# Patient Record
Sex: Male | Born: 1975
Health system: Southern US, Community
[De-identification: ages and names within clinical notes are randomized; demographics above are authoritative.]

## PROBLEM LIST (undated history)

## (undated) DIAGNOSIS — K219 Gastro-esophageal reflux disease without esophagitis: Secondary | ICD-10-CM

## (undated) DIAGNOSIS — M1990 Unspecified osteoarthritis, unspecified site: Secondary | ICD-10-CM

## (undated) DIAGNOSIS — F319 Bipolar disorder, unspecified: Secondary | ICD-10-CM

## (undated) DIAGNOSIS — T7840XA Allergy, unspecified, initial encounter: Secondary | ICD-10-CM

## (undated) DIAGNOSIS — E559 Vitamin D deficiency, unspecified: Secondary | ICD-10-CM

## (undated) DIAGNOSIS — F419 Anxiety disorder, unspecified: Secondary | ICD-10-CM

## (undated) HISTORY — DX: Unspecified osteoarthritis, unspecified site: M19.90

## (undated) HISTORY — DX: Vitamin D deficiency, unspecified: E55.9

## (undated) HISTORY — DX: Allergy, unspecified, initial encounter: T78.40XA

## (undated) HISTORY — DX: Anxiety disorder, unspecified: F41.9

## (undated) HISTORY — DX: Gastro-esophageal reflux disease without esophagitis: K21.9

## (undated) HISTORY — DX: Bipolar disorder, unspecified: F31.9

## (undated) HISTORY — PX: PILONIDAL CYST EXCISION: SHX744

---

## 2009-08-14 ENCOUNTER — Emergency Department: Payer: Self-pay | Admitting: Emergency Medicine

## 2009-08-15 ENCOUNTER — Emergency Department: Payer: Self-pay | Admitting: Emergency Medicine

## 2011-02-10 ENCOUNTER — Ambulatory Visit (INDEPENDENT_AMBULATORY_CARE_PROVIDER_SITE_OTHER): Payer: Medicare Other | Admitting: Physician Assistant

## 2011-02-10 DIAGNOSIS — F319 Bipolar disorder, unspecified: Secondary | ICD-10-CM

## 2011-02-26 ENCOUNTER — Encounter (INDEPENDENT_AMBULATORY_CARE_PROVIDER_SITE_OTHER): Payer: Medicare Other | Admitting: Physician Assistant

## 2011-02-26 DIAGNOSIS — F39 Unspecified mood [affective] disorder: Secondary | ICD-10-CM

## 2011-03-26 ENCOUNTER — Encounter (HOSPITAL_COMMUNITY): Payer: Medicare Other | Admitting: Physician Assistant

## 2011-03-31 ENCOUNTER — Encounter (INDEPENDENT_AMBULATORY_CARE_PROVIDER_SITE_OTHER): Payer: Medicare Other | Admitting: Physician Assistant

## 2011-03-31 DIAGNOSIS — F319 Bipolar disorder, unspecified: Secondary | ICD-10-CM

## 2011-06-02 ENCOUNTER — Encounter (HOSPITAL_COMMUNITY): Payer: Medicare Other | Admitting: Physician Assistant

## 2011-07-03 ENCOUNTER — Other Ambulatory Visit (HOSPITAL_COMMUNITY): Payer: Self-pay | Admitting: *Deleted

## 2011-07-03 DIAGNOSIS — F39 Unspecified mood [affective] disorder: Secondary | ICD-10-CM

## 2011-07-03 MED ORDER — LITHIUM CARBONATE ER 450 MG PO TBCR: EXTENDED_RELEASE_TABLET | ORAL | Status: DC

## 2011-07-03 MED ORDER — CLONAZEPAM 2 MG PO TABS: 2.0000 mg | ORAL_TABLET | Freq: Four times a day (QID) | ORAL | Status: DC

## 2011-07-22 ENCOUNTER — Ambulatory Visit (HOSPITAL_COMMUNITY): Payer: Medicare Other | Admitting: Physician Assistant

## 2011-08-21 ENCOUNTER — Other Ambulatory Visit (HOSPITAL_COMMUNITY): Payer: Self-pay | Admitting: Physician Assistant

## 2011-08-21 ENCOUNTER — Other Ambulatory Visit (HOSPITAL_COMMUNITY): Payer: Self-pay

## 2011-08-21 DIAGNOSIS — F39 Unspecified mood [affective] disorder: Secondary | ICD-10-CM

## 2011-08-21 DIAGNOSIS — F319 Bipolar disorder, unspecified: Secondary | ICD-10-CM

## 2011-08-21 MED ORDER — CLONAZEPAM 2 MG PO TABS: 2.0000 mg | ORAL_TABLET | Freq: Four times a day (QID) | ORAL | Status: DC | PRN

## 2011-08-21 MED ORDER — LITHIUM CARBONATE ER 450 MG PO TBCR: EXTENDED_RELEASE_TABLET | ORAL | Status: DC

## 2011-08-21 MED ORDER — OXCARBAZEPINE 300 MG PO TABS: 300.0000 mg | ORAL_TABLET | Freq: Two times a day (BID) | ORAL | Status: DC

## 2011-08-31 ENCOUNTER — Ambulatory Visit (INDEPENDENT_AMBULATORY_CARE_PROVIDER_SITE_OTHER): Payer: Medicare Other | Admitting: Physician Assistant

## 2011-08-31 DIAGNOSIS — Z79899 Other long term (current) drug therapy: Secondary | ICD-10-CM

## 2011-08-31 DIAGNOSIS — F319 Bipolar disorder, unspecified: Secondary | ICD-10-CM

## 2011-08-31 MED ORDER — PRAZOSIN HCL 1 MG PO CAPS: 1.0000 mg | ORAL_CAPSULE | Freq: Every day | ORAL | Status: DC

## 2011-08-31 NOTE — Progress Notes (Signed)
   [facility] Behavioral Health Follow-up Outpatient Visit  Derreon Consalvo August 17, 1976  Date: 08/31/11   Subjective: Lars presents today to follow up on his medications for bipolar. He reports that he is not sleeping well; he falls asleep okay, but then wakes up in our 2 later, after having nightmares. He denies that he is having any auditory or visual hallucinations. He denies having any suicidal or homicidal ideation. He states that he has tried every sleep medication without relief. He says, "name a medication, and I've taken it."  He reports that he was prescribed clonidine in the past, which helped him to sleep, but after taking it a few consecutive nights he had intolerable side effects.  There were no vitals filed for this visit.  Mental Status Examination  Appearance: Casual Alert: Yes Attention: good  Cooperative: Yes Eye Contact: Fair Speech: Clear and even Psychomotor Activity: Increased Memory/Concentration: Intact Oriented: person, place, time/date and situation Mood: Anxious and Irritable Affect: Full Range Thought Processes and Associations: Logical Fund of Knowledge: Fair Thought Content:  Insight: Fair Judgement: Fair  Diagnosis: Bipolar disorder, most recent episode manic  Treatment Plan: We will prescribe for him Minipress to see if it will help his sleep, as well as decrease his nightmares. He will followup in one month.  Kobe Jansma, PA

## 2011-09-25 ENCOUNTER — Other Ambulatory Visit (HOSPITAL_COMMUNITY): Payer: Self-pay | Admitting: Physician Assistant

## 2011-09-25 DIAGNOSIS — F319 Bipolar disorder, unspecified: Secondary | ICD-10-CM

## 2011-09-25 MED ORDER — CLONAZEPAM 2 MG PO TABS: 2.0000 mg | ORAL_TABLET | Freq: Four times a day (QID) | ORAL | Status: DC | PRN

## 2011-09-25 MED ORDER — LITHIUM CARBONATE ER 450 MG PO TBCR: EXTENDED_RELEASE_TABLET | ORAL | Status: DC

## 2011-09-25 MED ORDER — OXCARBAZEPINE 300 MG PO TABS: 300.0000 mg | ORAL_TABLET | Freq: Two times a day (BID) | ORAL | Status: DC

## 2011-09-28 ENCOUNTER — Other Ambulatory Visit (HOSPITAL_COMMUNITY): Payer: Self-pay | Admitting: Physician Assistant

## 2011-09-28 ENCOUNTER — Ambulatory Visit (HOSPITAL_COMMUNITY): Payer: Medicare Other | Admitting: Physician Assistant

## 2011-10-12 ENCOUNTER — Ambulatory Visit (HOSPITAL_COMMUNITY): Payer: Medicare Other | Admitting: Physician Assistant

## 2011-11-02 ENCOUNTER — Ambulatory Visit (INDEPENDENT_AMBULATORY_CARE_PROVIDER_SITE_OTHER): Payer: Medicare Other | Admitting: Physician Assistant

## 2011-11-02 DIAGNOSIS — F319 Bipolar disorder, unspecified: Secondary | ICD-10-CM

## 2011-11-02 DIAGNOSIS — Z79899 Other long term (current) drug therapy: Secondary | ICD-10-CM

## 2011-11-02 NOTE — Progress Notes (Signed)
   [facility] Behavioral Health Follow-up Outpatient Visit  Donta Fuster Dec 31, 1975  Date: 11/02/11   Subjective: Jesua presents today to followup on medications prescribed for bipolar disorder. He reports that he took the Minipress for 3 nights and was able to get caught up on his sleep. He Is currently not taking any more Minipress, and reports that his wife is giving him an herbal sleep aid called Sleep Ease. He reports that his mood is stable. He denies any suicidal or homicidal ideation. He denies any auditory or visual hallucinations.  There were no vitals filed for this visit.  Mental Status Examination  Appearance: Disheveled Alert: Yes Attention: good  Cooperative: Yes Eye Contact: Good Speech: Clear and even Psychomotor Activity: Normal Memory/Concentration: Intact Oriented: person, place, time/date and situation Mood: Euthymic Affect: Congruent Thought Processes and Associations: Intact and Logical Fund of Knowledge: Good Thought Content:  Insight: Fair Judgement: Good  Diagnosis: Bipolar disorder not otherwise specified  Treatment Plan: We will continue his lithium at 450 mg in the morning and 900 mg at night. We will continue his Trileptal at 300 mg twice daily. He will continue taking Klonopin 2 mg up to 4 times daily as needed. He can take the Minipress on an as-needed basis for sleep. We have ordered labs to check his lithium level as well as CBC, seen at, and TSH. He will followup in 3 months.  Aleena Kirkeby, PA

## 2011-11-05 ENCOUNTER — Other Ambulatory Visit (HOSPITAL_COMMUNITY): Payer: Self-pay | Admitting: *Deleted

## 2011-11-05 DIAGNOSIS — F319 Bipolar disorder, unspecified: Secondary | ICD-10-CM

## 2012-01-04 ENCOUNTER — Ambulatory Visit (INDEPENDENT_AMBULATORY_CARE_PROVIDER_SITE_OTHER): Payer: Medicare Other | Admitting: Physician Assistant

## 2012-01-04 DIAGNOSIS — Z79899 Other long term (current) drug therapy: Secondary | ICD-10-CM

## 2012-01-04 DIAGNOSIS — F319 Bipolar disorder, unspecified: Secondary | ICD-10-CM | POA: Insufficient documentation

## 2012-01-04 NOTE — Progress Notes (Signed)
   [facility] Behavioral Health Follow-up Outpatient Visit  Frederick [name] 11-Sep-1975  Date: 01/04/2012   Subjective: Pearly presents today to followup on his medications prescribed for bipolar disorder. He reports that he has been doing well. He endorses a stable mood. His sleep and appetite are both good. He denies any irritability or agitation. He denies any suicidal or homicidal ideation. He denies any auditory or visual hallucinations.  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 This seems to bring him much gratification. He also reports at other times he has been fishing.  There were no vitals filed for this visit.  Mental Status Examination  Appearance: Fairly groomed and casually dressed Alert: Yes Attention: good  Cooperative: Yes Eye Contact: Good Speech: Clear and coherent Psychomotor Activity: Normal Memory/Concentration: Intact Oriented: person, place, time/date and situation Mood: Euthymic Affect: Appropriate Thought Processes and Associations: Coherent and Logical Fund of Knowledge: Good Thought Content: Normal Insight: Good Judgement: Good  Diagnosis: Bipolar disorder NOS  Treatment Plan: We will continue his Klonopin 2 mg up to 4 times daily, Eskalith 450 mg each morning and 900 mg at bedtime, as well as Trileptal 300 mg twice daily. He is no longer taking the Minipress but if he desires refills we will grant him. He will followup in 3 months.  Adaisha Campise, PA-C

## 2012-02-10 ENCOUNTER — Telehealth (HOSPITAL_COMMUNITY): Payer: Self-pay | Admitting: *Deleted

## 2012-02-10 NOTE — Telephone Encounter (Signed)
States [facility] recently changed his medications to mail order, and that [facility] has not received the refills. [facility] Fax [phone] [facility]   [phone]

## 2012-02-15 ENCOUNTER — Other Ambulatory Visit (HOSPITAL_COMMUNITY): Payer: Self-pay | Admitting: *Deleted

## 2012-02-16 ENCOUNTER — Other Ambulatory Visit (HOSPITAL_COMMUNITY): Payer: Self-pay | Admitting: *Deleted

## 2012-02-16 DIAGNOSIS — F319 Bipolar disorder, unspecified: Secondary | ICD-10-CM

## 2012-02-16 MED ORDER — LITHIUM CARBONATE ER 450 MG PO TBCR: EXTENDED_RELEASE_TABLET | ORAL | Status: DC

## 2012-02-16 MED ORDER — OXCARBAZEPINE 300 MG PO TABS: 300.0000 mg | ORAL_TABLET | Freq: Two times a day (BID) | ORAL | Status: DC

## 2012-02-16 MED ORDER — CLONAZEPAM 2 MG PO TABS: 2.0000 mg | ORAL_TABLET | Freq: Four times a day (QID) | ORAL | Status: DC | PRN

## 2012-04-11 ENCOUNTER — Ambulatory Visit (INDEPENDENT_AMBULATORY_CARE_PROVIDER_SITE_OTHER): Payer: Medicare Other | Admitting: Physician Assistant

## 2012-04-11 DIAGNOSIS — F319 Bipolar disorder, unspecified: Secondary | ICD-10-CM

## 2012-04-11 MED ORDER — OXCARBAZEPINE 300 MG PO TABS: ORAL_TABLET | ORAL | Status: DC

## 2012-04-11 MED ORDER — LITHIUM CARBONATE ER 450 MG PO TBCR: EXTENDED_RELEASE_TABLET | ORAL | Status: DC

## 2012-04-11 MED ORDER — CLONAZEPAM 2 MG PO TABS: 2.0000 mg | ORAL_TABLET | Freq: Four times a day (QID) | ORAL | Status: DC | PRN

## 2012-04-11 NOTE — Progress Notes (Signed)
   [facility] Behavioral Health Follow-up Outpatient Visit  Frederick [name] [birth year]  Date: 04/11/2012   Subjective: Frederick [name] presents today to followup on his treatment for bipolar disorder. He reports that last month he began to experience some problems sleeping and felt he was having a manic episode, so he began taking Minipress at bedtime, which resolved his manic episode. He reports that last week his primary care provider did multiple blood tests, and did an ultrasound of his thyroid, liver, and kidneys, presumably because he is on lithium. He is having those results faxed to this office. He has been taking his lithium 450 mg each morning and 900 mg at bedtime, as well as Trileptal 300 mg in the morning and 300 mg at bedtime. He continues to take the Klonopin as prescribed. He denies any suicidal or homicidal ideation. He denies any auditory or visual hallucinations.  There were no vitals filed for this visit.  Mental Status Examination  Appearance: Casual Alert: Yes Attention: good  Cooperative: Yes Eye Contact: Good Speech: Clear and coherent Psychomotor Activity: Normal Memory/Concentration: Intact Oriented: person, place, time/date and situation Mood: Euthymic Affect: Blunt Thought Processes and Associations: Coherent and Logical Fund of Knowledge: Good Thought Content: Normal Insight: Good Judgement: Good  Diagnosis: Polar disorder not otherwise specified  Treatment Plan: We will increase his Trileptal back to 300 mg each morning and 600 mg at bedtime. We'll continue his lithium at 450 mg each morning and 900 mg at bedtime, as well as Klonopin 2 mg 4 times a day when necessary. We will appreciate the results of his recent lab work from his PCP, and he is to call the office in 2 weeks to report on his response to the increased Trileptal. If his laboratory results warrant, we may increase the lithium. He will return for followup in 2 months.  Jesselle Laflamme, PA-C

## 2012-06-20 ENCOUNTER — Ambulatory Visit (INDEPENDENT_AMBULATORY_CARE_PROVIDER_SITE_OTHER): Payer: Medicare Other | Admitting: Physician Assistant

## 2012-06-20 DIAGNOSIS — F3110 Bipolar disorder, current episode manic without psychotic features, unspecified: Secondary | ICD-10-CM

## 2012-06-20 DIAGNOSIS — F319 Bipolar disorder, unspecified: Secondary | ICD-10-CM

## 2012-06-20 NOTE — Progress Notes (Signed)
   [facility] Behavioral Health Follow-up Outpatient Visit  Frederick [name] [birth year]  Date: 06/20/2012   Subjective: Frederick [name] presents today to followup on his treatment for bipolar disorder. At his last appointment we increased his Trileptal to 300 mg each morning, and 600 mg at bedtime. He reports that this has improved his sleep significantly, and he no longer has any feelings of mania. He does complain that the increased dose of Trileptal causes him to have a hard stool, which causes painful defecation. He is able to adjust his dose of Trileptal as needed to avoid this being too much of a problem. He reports that he still has disturbing dreams from time to time, but will take a Minipress which helps to relieve that. He reports that Dr. Lacie Scotts at [facility] completed the tests and, per the patient's report, everything was fine. Nuh denies any suicidal or homicidal ideation. He denies any auditory or visual hallucinations.  There were no vitals filed for this visit.  Mental Status Examination  Appearance: Fairly groomed and casually dressed Alert: Yes Attention: good  Cooperative: Yes Eye Contact: Good Speech: Clear and coherent Psychomotor Activity: Normal Memory/Concentration: Intact Oriented: person, place, time/date and situation Mood: Euthymic Affect: Appropriate Thought Processes and Associations: Linear Fund of Knowledge: Good Thought Content: Normal Insight: Good Judgement: Good  Diagnosis: Bipolar disorder NOS, most recent episode manic  Treatment Plan: We will continue his lithium 450 mg each morning and 900 mg at bedtime, Trileptal 300 mg each morning and 600 mg at bedtime, and Klonopin 2 mg 4 times daily as needed for anxiety. He is also prescribed Minipress 1 mg to take at bedtime can use on an as-needed basis. He will return for followup in 3 months.  Dovie Kapusta, PA-C

## 2012-07-07 ENCOUNTER — Other Ambulatory Visit (HOSPITAL_COMMUNITY): Payer: Self-pay | Admitting: *Deleted

## 2012-07-07 DIAGNOSIS — F319 Bipolar disorder, unspecified: Secondary | ICD-10-CM

## 2012-07-07 MED ORDER — LITHIUM CARBONATE ER 450 MG PO TBCR: EXTENDED_RELEASE_TABLET | ORAL | Status: DC

## 2012-07-07 MED ORDER — OXCARBAZEPINE 300 MG PO TABS: ORAL_TABLET | ORAL | Status: DC

## 2012-09-20 ENCOUNTER — Ambulatory Visit (INDEPENDENT_AMBULATORY_CARE_PROVIDER_SITE_OTHER): Payer: Medicare Other | Admitting: Physician Assistant

## 2012-09-20 DIAGNOSIS — F319 Bipolar disorder, unspecified: Secondary | ICD-10-CM

## 2012-09-20 MED ORDER — CLONIDINE HCL 0.1 MG PO TABS: 0.1000 mg | ORAL_TABLET | Freq: Every day | ORAL | Status: DC

## 2012-09-20 MED ORDER — CLONAZEPAM 2 MG PO TABS: 2.0000 mg | ORAL_TABLET | Freq: Four times a day (QID) | ORAL | Status: DC | PRN

## 2012-09-20 NOTE — Progress Notes (Signed)
   Frederick [name]  Frederick [name] 1976/03/26  Date: 09/20/2012   Subjective: [name] presents today to followup on his treatment for bipolar disorder. He reports that beginning in mid-December through the first of the year he contracted bronchitis which turned into pneumonia and he was bed ridden. This caused has sleep pattern to become skewed, and he has been sleeping during the day and up at night. He feels that this is some mild mania, but he has not experienced other symptoms. He denies any suicidal or homicidal ideation. He denies any auditory or visual hallucinations.  There were no vitals filed for this [name].  Mental Status Examination  Appearance: Casual Alert: Yes Attention: good  Cooperative: Yes Eye Contact: Good Speech: Clear and coherent Psychomotor Activity: Normal Memory/Concentration: Intact Oriented: person, place, time/date and situation Mood: Dysphoric Affect: Congruent Thought Processes and Associations: Linear Fund of Knowledge: Good Thought Content: Normal Insight: Good Judgement: Good  Diagnosis: Bipolar disorder NOS  Treatment Plan: We will continue the Klonopin 2 mg 4 times daily, lithium 450 mg each morning and 900 mg at bedtime, Trileptal 300 mg each morning and 600 mg at bedtime, and Minipress 1 mg at bedtime. We'll add to that clonidine 0.1 mg at bedtime as needed for sleep. He will return for followup in 3 months.  Adryanna Friedt, PA-C

## 2012-12-20 ENCOUNTER — Ambulatory Visit (INDEPENDENT_AMBULATORY_CARE_PROVIDER_SITE_OTHER): Payer: 59 | Admitting: Physician Assistant

## 2012-12-20 VITALS — BP 119/80 | HR 68 | Ht 74.0 in | Wt 238.0 lb

## 2012-12-20 DIAGNOSIS — F319 Bipolar disorder, unspecified: Secondary | ICD-10-CM

## 2012-12-20 MED ORDER — OXCARBAZEPINE 300 MG PO TABS: ORAL_TABLET | ORAL | Status: DC

## 2012-12-20 MED ORDER — CLONAZEPAM 2 MG PO TABS: 2.0000 mg | ORAL_TABLET | Freq: Four times a day (QID) | ORAL | Status: DC | PRN

## 2012-12-20 MED ORDER — LITHIUM CARBONATE ER 450 MG PO TBCR: EXTENDED_RELEASE_TABLET | ORAL | Status: DC

## 2012-12-20 NOTE — Progress Notes (Signed)
   [facility] Behavioral Health Follow-up Outpatient Visit  Feras Gardella 1976/03/23  Date: 12/20/2012   Subjective: Maxmilian presents today to followup on his treatment for bipolar disorder. He states "I am better than I was." He reports that his sleep pattern has normalized. He used the clonidine to help him to adjust his sleep pattern, and it was helpful. He reports that his mood is stable. 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 He dislocated the fourth finger on his right hand playing softball. He denies any suicidal or homicidal ideation. He denies any auditory or visual hallucinations.  Filed Vitals:   12/20/12 1321  BP: 119/80  Pulse: 68    Mental Status Examination  Appearance: Casual Alert: Yes Attention: good  Cooperative: Yes Eye Contact: Good Speech: Clear and coherent Psychomotor Activity: Normal Memory/Concentration: Intact Oriented: person, place, time/date and situation Mood: Euthymic Affect: Congruent Thought Processes and Associations: Linear Fund of Knowledge: Good Thought Content: Normal Insight: Good Judgement: Good  Diagnosis: Bipolar disorder, unspecified  Treatment Plan: We will continue his clonazepam 2 mg 4 times daily as needed, lithium 450 mg each morning and 900 mg at bedtime, Trileptal 300 mg each morning and 600 mg at bedtime, and clonidine 0.1 mg at bedtime as needed for sleep. We will discontinue the Minipress. He'll return for followup in 3 months.  Mozelle Remlinger, PA-C

## 2013-03-28 ENCOUNTER — Encounter (HOSPITAL_COMMUNITY): Payer: Self-pay | Admitting: Physician Assistant

## 2013-03-28 ENCOUNTER — Ambulatory Visit (INDEPENDENT_AMBULATORY_CARE_PROVIDER_SITE_OTHER): Payer: Medicare Other | Admitting: Physician Assistant

## 2013-03-28 VITALS — BP 122/88 | HR 82 | Ht 74.0 in | Wt 225.8 lb

## 2013-03-28 DIAGNOSIS — F319 Bipolar disorder, unspecified: Secondary | ICD-10-CM

## 2013-03-28 MED ORDER — OXCARBAZEPINE 300 MG PO TABS: ORAL_TABLET | ORAL | Status: DC

## 2013-03-28 MED ORDER — LITHIUM CARBONATE ER 450 MG PO TBCR: EXTENDED_RELEASE_TABLET | ORAL | Status: DC

## 2013-03-28 MED ORDER — CLONAZEPAM 2 MG PO TABS: 2.0000 mg | ORAL_TABLET | Freq: Four times a day (QID) | ORAL | Status: DC | PRN

## 2013-03-28 NOTE — Progress Notes (Signed)
[facility] Behavioral [facility] [id number] Progress Note  Frederick [name] [id number] y.o.  03/28/2013 1:22 PM  Chief Complaint: Followup visit for bipolar disorder  History of Present Illness: Frederick [name] presents today to followup on his treatment for bipolar disorder. He reports that his mood has been stable. His sleep has been good. He has had to use the clonidine on 2 or 3 occasions since he was last here to help him to reestablish his sleep pattern when he gets off. He continues to spend his time working on his home, and states that he only has one more room to complete. 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 He is also been to [place] caring for his 8 year old grandmother who is suffering from the sequela of diabetes.  Suicidal Ideation: No Plan Formed: No Patient has means to carry out plan: No  Homicidal Ideation: No Plan Formed: No Patient has means to carry out plan: No  Review of Systems: Psychiatric: Agitation: No Hallucination: No Depressed Mood: No Insomnia: No Hypersomnia: No Altered Concentration: No Feels Worthless: No Grandiose Ideas: No Belief In Special Powers: No New/Increased Substance Abuse: No Compulsions: No  Neurologic: Headache: No Seizure: No Paresthesias: No  Past Medical Family, Social History: None on file  Outpatient Encounter Prescriptions as of 03/28/2013  Medication Sig Dispense Refill  . clonazePAM (KLONOPIN) 2 MG tablet Take 1 tablet (2 mg total) by mouth 4 (four) times daily as needed for anxiety.  120 tablet  2  . cloNIDine (CATAPRES) 0.1 MG tablet Take 1 tablet (0.1 mg total) by mouth at bedtime.  30 tablet  2  . lithium carbonate (ESKALITH) 450 MG CR tablet Take one tablet by mouth in the morning and two tablets by mouth every evening  270 tablet  0  . Oxcarbazepine (TRILEPTAL) 300 MG tablet Take one tablet each morning, and two at bedtime.  270 tablet  0  . [DISCONTINUED] clonazePAM (KLONOPIN) 2 MG tablet Take 1 tablet (2  mg total) by mouth 4 (four) times daily as needed for anxiety.  120 tablet  2  . [DISCONTINUED] lithium carbonate (ESKALITH) 450 MG CR tablet Take one tablet by mouth in the morning and two tablets by mouth every evening  270 tablet  0  . [DISCONTINUED] Oxcarbazepine (TRILEPTAL) 300 MG tablet Take one tablet each morning, and two at bedtime.  270 tablet  0   No facility-administered encounter medications on file as of 03/28/2013.    Past Psychiatric History/Hospitalization(s): Anxiety: Yes Bipolar Disorder: Yes Depression: Yes Mania: Yes Psychosis: No Schizophrenia: No Personality Disorder: No Hospitalization for psychiatric illness: No History of Electroconvulsive Shock Therapy: No Prior Suicide Attempts: No  Physical Exam: Constitutional:  Vital signs:  BP 122/88, Pulse 82, Height 6'2", Weight 225#  General Appearance: alert, oriented, no acute distress, well nourished and casually dressed  Musculoskeletal: Strength & Muscle Tone: within normal limits Gait & Station: normal Patient leans: N/A  Psychiatric: Speech (describe rate, volume, coherence, spontaneity, and abnormalities if any): Clear and coherent at a rate rate and rhythm and normal volume  Thought Process (describe rate, content, abstract reasoning, and computation): Within normal limit  Associations: Intact  Thoughts: normal  Mental Status: Orientation: oriented to person, place, time/date and situation Mood & Affect: normal affect Attention Span & Concentration: Intact  Medical Decision Making (Choose Three): Established Problem, Stable/Improving (1), Review of Psycho-Social Stressors (1) and Review of Medication Regimen & Side Effects (2)  Assessment: Axis I: Bipolar disorder not otherwise  specified  Axis II: Deferred  Axis III: Noncontributory  Axis IV: Mild  Axis V: 75   Plan: We will continue his Klonopin 2 mg 4 times daily as needed, lithium 450 mg each morning and 900 mg at bedtime,  Trileptal 300 mg each morning and 600 mg at bedtime, and clonidine 0.1 mg at bedtime as needed for sleep. He will return for followup in 3 months. If he continues to remain stable we can begin to stretch his appointments out.  Naziah Portee, PA-C 03/28/2013

## 2013-06-28 ENCOUNTER — Ambulatory Visit (HOSPITAL_COMMUNITY): Payer: Self-pay | Admitting: Physician Assistant

## 2013-06-29 ENCOUNTER — Ambulatory Visit (HOSPITAL_COMMUNITY): Payer: Self-pay | Admitting: Psychiatry

## 2013-09-06 ENCOUNTER — Encounter (INDEPENDENT_AMBULATORY_CARE_PROVIDER_SITE_OTHER): Payer: Self-pay

## 2013-09-06 ENCOUNTER — Ambulatory Visit (INDEPENDENT_AMBULATORY_CARE_PROVIDER_SITE_OTHER): Payer: Medicare Other | Admitting: Psychiatry

## 2013-09-06 ENCOUNTER — Ambulatory Visit (HOSPITAL_COMMUNITY): Payer: Self-pay | Admitting: Psychiatry

## 2013-09-06 DIAGNOSIS — F319 Bipolar disorder, unspecified: Secondary | ICD-10-CM | POA: Diagnosis not present

## 2013-09-06 DIAGNOSIS — F3162 Bipolar disorder, current episode mixed, moderate: Secondary | ICD-10-CM | POA: Insufficient documentation

## 2013-09-06 MED ORDER — CLONAZEPAM 2 MG PO TABS: 2.0000 mg | ORAL_TABLET | Freq: Four times a day (QID) | ORAL | Status: DC | PRN

## 2013-09-06 MED ORDER — LITHIUM CARBONATE ER 450 MG PO TBCR: EXTENDED_RELEASE_TABLET | ORAL | Status: DC

## 2013-09-06 MED ORDER — OXCARBAZEPINE 300 MG PO TABS: ORAL_TABLET | ORAL | Status: DC

## 2013-09-06 NOTE — Progress Notes (Signed)
[facility]  09/06/2013 3:37 PM Gaylord ShihWilliam Vaughn  MRN:  [id number] Subjective:  Easy-going This patient is a 38 year old white married father who is on disability or bipolar disorder and he says other conditions. This has been married for 17 years and is stable marriage to a Engineer, civil (consulting)nurse. He is to sons age 40 and 5311 who are here with him today. The patient recently has started taking one class at a local community college. Today the patient denies being depressed. He is sleeping and eating well. He's got good energy. He is no complaints of concentration and a good sense of worth. He denies suicidal ideation he denies any suicide attempts. The patient is able to enjoy music and fishing. He also reads. In evaluation of past episodes of illness it is difficult to elicit them. A close evaluation the patient is unable to describe episodes of euphoria or intense irritability. Yet it is noted he is psychiatrically hospitalized 2 times the last one in 2004. He knows the hospitals in [place] but doesn't know its name. The patient describes an episode of illness is characterized by super amount of energy, rapid speech racing thoughts and decreased need for sleep. He is very distractible and scattered during the episode. During the episode he denies being your double nor being euphoric. I suspect the interpretation of his mood is made by the treating psychiatrist and perhaps also by his wife who is not here today. I am unclear of the index case of illness of this patient. What is evident is that he is on massive amounts of medicines. He takes lithium 450 mg one in the morning and 2 at night, Trileptal 300 mg one in the morning and 2 at night and Klonopin 2 mg 4 times a day. He also takes clonidine point one at night at times. The patient says his last episode occurred when his grandmother died. The patient is not accurate are clear about when episodes occurred in the past. Diagnosis:   DSM5: Schizophrenia Disorders:    Obsessive-Compulsive Disorders:   Trauma-Stressor Disorders:   Substance/Addictive Disorders:   Depressive Disorders:    Axis I: Bipolar, Manic  ADL's:  Intact  Sleep: Good  Appetite:  Good  Suicidal Ideation:  no Homicidal Ideation:  no AEB (as evidenced by):  Psychiatric Specialty Exam: ROS  There were no vitals taken for this visit.There is no weight on file to calculate BMI.  General Appearance: Casual  Eye Contact::  Good  Speech:  Clear and Coherent  Volume:  Normal  Mood:  Euthymic  Affect:  Constricted  Thought Process:  Coherent  Orientation:  Full (Time, Place, and Person)  Thought Content:  NA and WDL  Suicidal Thoughts:  No  Homicidal Thoughts:  No  Memory:  nl  Judgement:  Good  Insight:  Fair  Psychomotor Activity:  Normal  Concentration:  Fair  Recall:  Good  Akathisia:  No  Handed:  Right  AIMS (if indicated):     Assets:  Desire for Improvement  Sleep:      Current Medications: Current Outpatient Prescriptions  Medication Sig Dispense Refill  . clonazePAM (KLONOPIN) 2 MG tablet Take 1 tablet (2 mg total) by mouth 4 (four) times daily as needed for anxiety.  120 tablet  1  . cloNIDine (CATAPRES) 0.1 MG tablet Take 1 tablet (0.1 mg total) by mouth at bedtime.  30 tablet  2  . lithium carbonate (ESKALITH) 450 MG CR tablet Take one tablet by mouth in  the morning and two tablets by mouth every evening  270 tablet  1  . Oxcarbazepine (TRILEPTAL) 300 MG tablet Take one tablet each morning, and two at bedtime.  270 tablet  0   No current facility-administered medications for this visit.    Lab Results: No results found for this or any previous visit (from the past 48 hour(s)).  Physical Findings: AIMS:  , ,  ,  ,    CIWA:    COWS:     Treatment Plan Summary: At this time given that this was a short visit an initial meeting for this very complex patient I will go ahead and continue his medicines for the next month or 2 and reevaluate him. He  claims he has been on his medicines for years. This includes Klonopin 2 mg 4 times a day, lithium 451 morning 2 at night and Trileptal 300 mg one in the morning and 2 at night. This patient was asked on his next visit to bring his wife with him. If this does not occur I reviewed his past notes in our system and reevaluate the need for such a high dose of Klonopin. At this time the patient Morrie Sheldon seems to be quite stable. I am hesitant to be stabilizing. On his next visit which I have an extensive evaluation of any substance misuse or abuse.  Plan:  Medical Decision Making Problem Points:  Established problem, stable/improving (1) Data Points:  Review of medication regiment & side effects (2)  I certify that inpatient services furnished can reasonably be expected to improve the patient's condition.   Lucas Mallow 09/06/2013, 3:37 PM

## 2013-10-30 DIAGNOSIS — Z1339 Encounter for screening examination for other mental health and behavioral disorders: Secondary | ICD-10-CM | POA: Diagnosis not present

## 2013-10-30 DIAGNOSIS — Z79899 Other long term (current) drug therapy: Secondary | ICD-10-CM | POA: Diagnosis not present

## 2013-10-30 DIAGNOSIS — F3112 Bipolar disorder, current episode manic without psychotic features, moderate: Secondary | ICD-10-CM | POA: Diagnosis not present

## 2013-11-15 ENCOUNTER — Ambulatory Visit (HOSPITAL_COMMUNITY): Payer: Self-pay | Admitting: Psychiatry

## 2013-12-28 DIAGNOSIS — F3112 Bipolar disorder, current episode manic without psychotic features, moderate: Secondary | ICD-10-CM | POA: Diagnosis not present

## 2013-12-28 DIAGNOSIS — Z79899 Other long term (current) drug therapy: Secondary | ICD-10-CM | POA: Diagnosis not present

## 2014-02-28 DIAGNOSIS — F3112 Bipolar disorder, current episode manic without psychotic features, moderate: Secondary | ICD-10-CM | POA: Diagnosis not present

## 2014-05-11 DIAGNOSIS — M545 Low back pain, unspecified: Secondary | ICD-10-CM | POA: Diagnosis not present

## 2014-05-11 DIAGNOSIS — Z79899 Other long term (current) drug therapy: Secondary | ICD-10-CM | POA: Diagnosis not present

## 2014-05-11 DIAGNOSIS — E785 Hyperlipidemia, unspecified: Secondary | ICD-10-CM | POA: Diagnosis not present

## 2014-05-11 DIAGNOSIS — M25559 Pain in unspecified hip: Secondary | ICD-10-CM | POA: Diagnosis not present

## 2014-05-11 DIAGNOSIS — F17200 Nicotine dependence, unspecified, uncomplicated: Secondary | ICD-10-CM | POA: Diagnosis not present

## 2014-05-28 DIAGNOSIS — M25559 Pain in unspecified hip: Secondary | ICD-10-CM | POA: Diagnosis not present

## 2014-05-28 DIAGNOSIS — M25552 Pain in left hip: Secondary | ICD-10-CM | POA: Diagnosis not present

## 2014-06-27 DIAGNOSIS — Z79899 Other long term (current) drug therapy: Secondary | ICD-10-CM | POA: Diagnosis not present

## 2014-06-27 DIAGNOSIS — F3160 Bipolar disorder, current episode mixed, unspecified: Secondary | ICD-10-CM | POA: Diagnosis not present

## 2014-07-06 DIAGNOSIS — S838X2A Sprain of other specified parts of left knee, initial encounter: Secondary | ICD-10-CM | POA: Diagnosis not present

## 2014-07-11 DIAGNOSIS — M25461 Effusion, right knee: Secondary | ICD-10-CM | POA: Diagnosis not present

## 2014-07-11 DIAGNOSIS — M2391 Unspecified internal derangement of right knee: Secondary | ICD-10-CM | POA: Diagnosis not present

## 2014-09-26 DIAGNOSIS — F3160 Bipolar disorder, current episode mixed, unspecified: Secondary | ICD-10-CM | POA: Diagnosis not present

## 2014-09-26 DIAGNOSIS — Z79899 Other long term (current) drug therapy: Secondary | ICD-10-CM | POA: Diagnosis not present

## 2015-02-12 DIAGNOSIS — Z79899 Other long term (current) drug therapy: Secondary | ICD-10-CM | POA: Diagnosis not present

## 2015-02-12 DIAGNOSIS — F3160 Bipolar disorder, current episode mixed, unspecified: Secondary | ICD-10-CM | POA: Diagnosis not present

## 2015-05-20 DIAGNOSIS — J019 Acute sinusitis, unspecified: Secondary | ICD-10-CM | POA: Diagnosis not present

## 2015-06-17 DIAGNOSIS — F3160 Bipolar disorder, current episode mixed, unspecified: Secondary | ICD-10-CM | POA: Diagnosis not present

## 2015-08-05 DIAGNOSIS — L02415 Cutaneous abscess of right lower limb: Secondary | ICD-10-CM | POA: Diagnosis not present

## 2015-08-07 DIAGNOSIS — L02415 Cutaneous abscess of right lower limb: Secondary | ICD-10-CM | POA: Diagnosis not present

## 2015-10-14 DIAGNOSIS — Z79899 Other long term (current) drug therapy: Secondary | ICD-10-CM | POA: Diagnosis not present

## 2015-10-14 DIAGNOSIS — F3160 Bipolar disorder, current episode mixed, unspecified: Secondary | ICD-10-CM | POA: Diagnosis not present

## 2015-10-22 ENCOUNTER — Ambulatory Visit: Payer: Self-pay | Admitting: Primary Care

## 2015-10-28 ENCOUNTER — Encounter: Payer: Self-pay | Admitting: Primary Care

## 2015-10-28 ENCOUNTER — Ambulatory Visit (INDEPENDENT_AMBULATORY_CARE_PROVIDER_SITE_OTHER): Payer: Medicare Other | Admitting: Primary Care

## 2015-10-28 VITALS — BP 124/86 | HR 77 | Temp 97.9°F | Ht 72.0 in | Wt 248.8 lb

## 2015-10-28 DIAGNOSIS — Z79899 Other long term (current) drug therapy: Secondary | ICD-10-CM

## 2015-10-28 DIAGNOSIS — M546 Pain in thoracic spine: Secondary | ICD-10-CM

## 2015-10-28 DIAGNOSIS — F319 Bipolar disorder, unspecified: Secondary | ICD-10-CM | POA: Diagnosis not present

## 2015-10-28 DIAGNOSIS — M549 Dorsalgia, unspecified: Secondary | ICD-10-CM | POA: Insufficient documentation

## 2015-10-28 LAB — COMPREHENSIVE METABOLIC PANEL
ALK PHOS: 86 U/L (ref 39–117)
ALT: 123 U/L — AB (ref 0–53)
AST: 57 U/L — AB (ref 0–37)
Albumin: 4.7 g/dL (ref 3.5–5.2)
BUN: 13 mg/dL (ref 6–23)
CO2: 26 mEq/L (ref 19–32)
Calcium: 9.5 mg/dL (ref 8.4–10.5)
Chloride: 106 mEq/L (ref 96–112)
Creatinine, Ser: 0.97 mg/dL (ref 0.40–1.50)
GFR: 90.32 mL/min (ref >60.00)
Glucose, Bld: 104 mg/dL — ABNORMAL HIGH (ref 70–99)
Potassium: 4.5 mEq/L (ref 3.5–5.1)
Sodium: 140 mEq/L (ref 135–145)
TOTAL PROTEIN: 7.6 g/dL (ref 6.0–8.3)
Total Bilirubin: 0.5 mg/dL (ref 0.2–1.2)

## 2015-10-28 NOTE — Progress Notes (Signed)
Subjective:    Patient ID: Frederick [name], male    DOB: 1976/05/05, 40 y.o.   MRN: [id number]  HPI  Frederick [name] is a 40 year old male who presents today to establish care and discuss the problems mentioned below. Will obtain old records. His last physical was years ago.   1) Back pain: Located to right side of thoracic back. He describes his pain as a cramping sensation that will radiate to his right obliques. He plays disc golf everyday and will repetitively twist his body in order to throw the frisbee. His pain has been present for 6 months total and is no worse. Denies numbness/tingling, radiation of pain to his lower extremities. He did fall on his back 3 weeks ago while trying to throw the frisbee, but this pain is the same as his prior pain several months ago.  2) Bipolar Disorder: Diagnosed years ago. Currently follows with Dr. Adron Bene in [place], [place]. He is managed on Lithium 450 mg, Oxcarbazepine 300 mg, and clonazepam 2 mg. Denies SI/HI. He follows with Dr. Janeece Riggers every 3 months.   Review of Systems  Respiratory: Negative for shortness of breath.   Cardiovascular: Negative for chest pain.  Musculoskeletal: Positive for myalgias and back pain.  Skin: Negative for wound.  Neurological: Negative for dizziness and numbness.  Psychiatric/Behavioral: Negative for suicidal ideas. The patient is not nervous/anxious.        Past Medical History  Diagnosis Date  . Bipolar disorder [facility])     Social History   Social History  . Marital Status: Married    Spouse Name: N/A  . Number of Children: N/A  . Years of Education: N/A   Occupational History  . Not on file.   Social History Main Topics  . Smoking status: Current Every Day Smoker -- 0.25 packs/day  . Smokeless tobacco: Not on file  . Alcohol Use: No  . Drug Use: No  . Sexual Activity: Not on file   Other Topics Concern  . Not on file   Social History Narrative   Married.   2 children.   Disabled.   Enjoys playing  disc golf.     No past surgical history on file.  Family History  Problem Relation Age of Onset  . Bipolar disorder Mother   . Arthritis Father   . Bipolar disorder Brother     Allergies  Allergen Reactions  . Alprazolam Itching and Nausea And Vomiting  . Clindamycin Hives  . Ziprasidone Hcl Itching    Current Outpatient Prescriptions on File Prior to Visit  Medication Sig Dispense Refill  . clonazePAM (KLONOPIN) 2 MG tablet Take 1 tablet (2 mg total) by mouth 4 (four) times daily as needed for anxiety. 120 tablet 1  . lithium carbonate (ESKALITH) 450 MG CR tablet Take one tablet by mouth in the morning and two tablets by mouth every evening 270 tablet 1  . Oxcarbazepine (TRILEPTAL) 300 MG tablet Take one tablet each morning, and two at bedtime. 270 tablet 0   No current facility-administered medications on file prior to visit.    BP 124/86 mmHg  Pulse 77  Temp(Src) 97.9 F (36.6 C) (Oral)  Ht 6' (1.829 m)  Wt 248 lb 12.8 oz (112.855 kg)  BMI 33.74 kg/m2  SpO2 98%    Objective:   Physical Exam  Constitutional: He appears well-nourished.  Neck: Neck supple.  Cardiovascular: Normal rate and regular rhythm.   Pulmonary/Chest: Effort normal and breath sounds normal.  Musculoskeletal:       Arms: Good ROM. Pain to right lower thoracic spine as indicated on picture. Small mass noted upon palpation that has been present for years per patient. Mass and area of concern are non tender.  Skin: Skin is warm and dry.  Psychiatric: He has a normal mood and affect.          Assessment & Plan:

## 2015-10-28 NOTE — Assessment & Plan Note (Signed)
Manic per patient. [place] Mood stable today. He is requesting lab testing for lithium levels as he is overdue. Lab orders placed and will fax over to psychiatry once received.

## 2015-10-28 NOTE — Progress Notes (Signed)
Pre visit review using our clinic review tool, if applicable. No additional management support is needed unless otherwise documented below in the visit note. 

## 2015-10-28 NOTE — Patient Instructions (Signed)
Complete lab work prior to leaving today. I will notify you of your results once received.   Your muscle pain is likely related to the repetitive twisting motion of disc golf. Try to refrain from extreme twisting or movement of your right side if possible. Please notify me if your discomfort becomes worse.  Please schedule a physical with me within the next 3-6 months. You may also schedule a lab only appointment 3-4 days prior. We will discuss your lab results in detail during your physical.  It was a pleasure to meet you today! Please don't hesitate to call me with any questions. Welcome to [facility]!

## 2015-10-28 NOTE — Assessment & Plan Note (Signed)
Present for 6 months. Located to right lower thoracic spine, worse with movement with playing disc golf. Exam unremarkable, no spine involvement.  Suspect symptoms related to repetitive twisting of disc golf as he plays frequently. Will treat with supportive measures and have him follow up if no improvement.

## 2015-10-29 LAB — LITHIUM LEVEL: LITHIUM LVL: 0.3 meq/L — AB (ref 0.80–1.40)

## 2015-10-30 ENCOUNTER — Encounter: Payer: Self-pay | Admitting: *Deleted

## 2015-11-21 ENCOUNTER — Encounter: Payer: Self-pay | Admitting: Primary Care

## 2015-11-21 ENCOUNTER — Ambulatory Visit (INDEPENDENT_AMBULATORY_CARE_PROVIDER_SITE_OTHER): Payer: Medicare Other | Admitting: Primary Care

## 2015-11-21 VITALS — BP 124/84 | HR 73 | Temp 97.7°F | Ht 72.0 in | Wt 246.0 lb

## 2015-11-21 DIAGNOSIS — H6121 Impacted cerumen, right ear: Secondary | ICD-10-CM | POA: Diagnosis not present

## 2015-11-21 NOTE — Progress Notes (Signed)
Pre visit review using our clinic review tool, if applicable. No additional management support is needed unless otherwise documented below in the visit note. 

## 2015-11-21 NOTE — Progress Notes (Signed)
   Subjective:    Patient ID: Frederick [name], male    DOB: 01/24/1976, 40 y.o.   MRN: [id number]  HPI  Frederick [name] is a [age] year old male who presents today with a chief complaint of ear fullness. His fullness is located to bilateral ears but right ear more so. He believes his right ear is impacted with cerumen. Denies fevers, cough, sore throat, congestion. His wife attempted to remove the ear wax with a q-tip and saline flushes without improvement.   Review of Systems  Constitutional: Negative for fever.  HENT: Negative for congestion, ear pain and sore throat.   Respiratory: Negative for cough.        Past Medical History  Diagnosis Date  . Bipolar disorder [facility](HCC)     Social History   Social History  . Marital Status: Married    Spouse Name: N/A  . Number of Children: N/A  . Years of Education: N/A   Occupational History  . Not on file.   Social History Main Topics  . Smoking status: Current Every Day Smoker -- 0.25 packs/day  . Smokeless tobacco: Not on file  . Alcohol Use: No  . Drug Use: No  . Sexual Activity: Not on file   Other Topics Concern  . Not on file   Social History Narrative   Married.   2 children.   Disabled.   Enjoys playing disc golf.     No past surgical history on file.  Family History  Problem Relation Age of Onset  . Bipolar disorder Mother   . Arthritis Father   . Bipolar disorder Brother     Allergies  Allergen Reactions  . Alprazolam Itching and Nausea And Vomiting  . Clindamycin Hives  . Ziprasidone Hcl Itching    Current Outpatient Prescriptions on File Prior to Visit  Medication Sig Dispense Refill  . clonazePAM (KLONOPIN) 2 MG tablet Take 1 tablet (2 mg total) by mouth 4 (four) times daily as needed for anxiety. 120 tablet 1  . lithium carbonate (ESKALITH) 450 MG CR tablet Take one tablet by mouth in the morning and two tablets by mouth every evening 270 tablet 1  . Oxcarbazepine (TRILEPTAL) 300 MG tablet Take one tablet  each morning, and two at bedtime. 270 tablet 0   No current facility-administered medications on file prior to visit.    BP 124/84 mmHg  Pulse 73  Temp(Src) 97.7 F (36.5 C) (Oral)  Ht 6' (1.829 m)  Wt 246 lb (111.585 kg)  BMI 33.36 kg/m2  SpO2 98%    Objective:   Physical Exam  Constitutional: He appears well-nourished.  HENT:  Left Ear: Tympanic membrane and ear canal normal. Tympanic membrane is not injected and not erythematous.  Nose: Nose normal. Right sinus exhibits no maxillary sinus tenderness and no frontal sinus tenderness. Left sinus exhibits no maxillary sinus tenderness and no frontal sinus tenderness.  Mouth/Throat: Oropharynx is clear and moist.  Right TM moderately impacted with cerumen, left TM without impaction, TM with small effusion. Right TM post irrigation   Skin: Skin is warm and dry.          Assessment & Plan:  Cerumen Impaction:  Right canal moderately impacted with cerumen. Unable to remove cerumen through instrumentation alone. Irrigation preformed to right ear by CMA. Right TM post irrigation WNL. Discussed cerumen impaction and to use Debrox drops for removal. Follow up PRN.

## 2015-11-21 NOTE — Patient Instructions (Addendum)
Do not use q-tips in your ears.  To help soften ear wax, use Debrox drops once or twice weekly. This will help to soften the wax to allow for complete removal.  We will monitor your liver function again during your upcoming physical.  It was a pleasure to see you today!  Cerumen Impaction The structures of the external ear canal secrete a waxy substance known as cerumen. Excess cerumen can build up in the ear canal, causing a condition known as cerumen impaction. Cerumen impaction can cause ear pain and disrupt the function of the ear. The rate of cerumen production differs for each individual. In certain individuals, the configuration of the ear canal may decrease his or her ability to naturally remove cerumen. CAUSES Cerumen impaction is caused by excessive cerumen production or buildup. RISK FACTORS  Frequent use of swabs to clean ears.  Having narrow ear canals.  Having eczema.  Being dehydrated. SIGNS AND SYMPTOMS  Diminished hearing.  Ear drainage.  Ear pain.  Ear itch. TREATMENT Treatment may involve:  Over-the-counter or prescription ear drops to soften the cerumen.  Removal of cerumen by a health care provider. This may be done with:  Irrigation with warm water. This is the most common method of removal.  Ear curettes and other instruments.  Surgery. This may be done in severe cases. HOME CARE INSTRUCTIONS  Take medicines only as directed by your health care provider.  Do not insert objects into the ear with the intent of cleaning the ear. PREVENTION  Do not insert objects into the ear, even with the intent of cleaning the ear. Removing cerumen as a part of normal hygiene is not necessary, and the use of swabs in the ear canal is not recommended.  Drink enough water to keep your urine clear or pale yellow.  Control your eczema if you have it. SEEK MEDICAL CARE IF:  You develop ear pain.  You develop bleeding from the ear.  The cerumen does not  clear after you use ear drops as directed.   This information is not intended to replace advice given to you by your health care provider. Make sure you discuss any questions you have with your health care provider.   Document Released: 09/10/2004 Document Revised: 08/24/2014 Document Reviewed: 03/20/2015 Elsevier Interactive Patient Education [employer]2016 [employer] Inc.

## 2015-12-13 ENCOUNTER — Encounter: Payer: Self-pay | Admitting: Primary Care

## 2016-01-09 DIAGNOSIS — F319 Bipolar disorder, unspecified: Secondary | ICD-10-CM | POA: Diagnosis not present

## 2016-01-17 ENCOUNTER — Other Ambulatory Visit: Payer: Self-pay | Admitting: Primary Care

## 2016-01-17 DIAGNOSIS — Z79899 Other long term (current) drug therapy: Secondary | ICD-10-CM

## 2016-01-17 DIAGNOSIS — R739 Hyperglycemia, unspecified: Secondary | ICD-10-CM

## 2016-01-17 DIAGNOSIS — Z13220 Encounter for screening for lipoid disorders: Secondary | ICD-10-CM

## 2016-01-17 DIAGNOSIS — E785 Hyperlipidemia, unspecified: Secondary | ICD-10-CM

## 2016-01-17 DIAGNOSIS — Z0000 Encounter for general adult medical examination without abnormal findings: Secondary | ICD-10-CM

## 2016-01-22 ENCOUNTER — Other Ambulatory Visit (INDEPENDENT_AMBULATORY_CARE_PROVIDER_SITE_OTHER): Payer: Medicare Other

## 2016-01-22 DIAGNOSIS — E785 Hyperlipidemia, unspecified: Secondary | ICD-10-CM

## 2016-01-22 DIAGNOSIS — R739 Hyperglycemia, unspecified: Secondary | ICD-10-CM | POA: Diagnosis not present

## 2016-01-22 DIAGNOSIS — Z0000 Encounter for general adult medical examination without abnormal findings: Secondary | ICD-10-CM

## 2016-01-22 DIAGNOSIS — Z79899 Other long term (current) drug therapy: Secondary | ICD-10-CM

## 2016-01-22 LAB — COMPREHENSIVE METABOLIC PANEL
ALBUMIN: 4.4 g/dL (ref 3.5–5.2)
ALT: 122 U/L — ABNORMAL HIGH (ref 0–53)
AST: 82 U/L — AB (ref 0–37)
Alkaline Phosphatase: 91 U/L (ref 39–117)
BUN: 15 mg/dL (ref 6–23)
CHLORIDE: 110 meq/L (ref 96–112)
CO2: 25 mEq/L (ref 19–32)
CREATININE: 1 mg/dL (ref 0.40–1.50)
Calcium: 9.1 mg/dL (ref 8.4–10.5)
GFR: 87.1 mL/min (ref >60.00)
GLUCOSE: 121 mg/dL — AB (ref 70–99)
POTASSIUM: 3.6 meq/L (ref 3.5–5.1)
SODIUM: 141 meq/L (ref 135–145)
Total Bilirubin: 0.6 mg/dL (ref 0.2–1.2)
Total Protein: 7 g/dL (ref 6.0–8.3)

## 2016-01-22 LAB — LIPID PANEL
CHOL/HDL RATIO: 5
CHOLESTEROL: 176 mg/dL (ref 0–200)
HDL: 36.5 mg/dL — ABNORMAL LOW (ref >39.00)
LDL CALC: 126 mg/dL — AB (ref 0–99)
NonHDL: 139.43
TRIGLYCERIDES: 68 mg/dL (ref 0.0–149.0)
VLDL: 13.6 mg/dL (ref 0.0–40.0)

## 2016-01-22 LAB — HEMOGLOBIN A1C: Hgb A1c MFr Bld: 5.3 % (ref 4.6–6.5)

## 2016-01-22 LAB — TSH: TSH: 1.47 u[IU]/mL (ref 0.35–4.50)

## 2016-01-28 ENCOUNTER — Encounter: Payer: Self-pay | Admitting: Primary Care

## 2016-01-28 ENCOUNTER — Ambulatory Visit (INDEPENDENT_AMBULATORY_CARE_PROVIDER_SITE_OTHER): Payer: Medicare Other | Admitting: Primary Care

## 2016-01-28 VITALS — BP 124/78 | HR 77 | Temp 98.1°F | Ht 72.0 in | Wt 235.8 lb

## 2016-01-28 DIAGNOSIS — Z79899 Other long term (current) drug therapy: Secondary | ICD-10-CM | POA: Diagnosis not present

## 2016-01-28 DIAGNOSIS — Z0000 Encounter for general adult medical examination without abnormal findings: Secondary | ICD-10-CM | POA: Diagnosis not present

## 2016-01-28 NOTE — Progress Notes (Signed)
Pre visit review using our clinic review tool, if applicable. No additional management support is needed unless otherwise documented below in the visit note. 

## 2016-01-28 NOTE — Progress Notes (Signed)
Patient ID: Frederick [name], male   DOB: [birth year], 40 y.o.   MRN: [id number]  HPI: Frederick [name] is a 40 year old male who presents today for his medicare annual wellness exam.  Past Medical History  Diagnosis Date  . Bipolar disorder [facility])     Current Outpatient Prescriptions  Medication Sig Dispense Refill  . clonazePAM (KLONOPIN) 2 MG tablet Take 1 tablet (2 mg total) by mouth 4 (four) times daily as needed for anxiety. 120 tablet 1  . lithium carbonate (ESKALITH) 450 MG CR tablet Take one tablet by mouth in the morning and two tablets by mouth every evening 270 tablet 1  . Oxcarbazepine (TRILEPTAL) 300 MG tablet Take one tablet each morning, and two at bedtime. 270 tablet 0   No current facility-administered medications for this visit.    Allergies  Allergen Reactions  . Alprazolam Itching and Nausea And Vomiting  . Clindamycin Hives  . Ziprasidone Hcl Itching    Family History  Problem Relation Age of Onset  . Bipolar disorder Mother   . Arthritis Father   . Bipolar disorder Brother     Social History   Social History  . Marital Status: Married    Spouse Name: N/A  . Number of Children: N/A  . Years of Education: N/A   Occupational History  . Not on file.   Social History Main Topics  . Smoking status: Current Every Day Smoker -- 0.25 packs/day  . Smokeless tobacco: Not on file  . Alcohol Use: No  . Drug Use: No  . Sexual Activity: Not on file   Other Topics Concern  . Not on file   Social History Narrative   Married.   2 children.   Disabled.   Enjoys playing disc golf.     Hospitiliaztions: None  Health Maintenance:    Flu: Did not complete last season  Tetanus: Completed in 2015  Eye Doctor: Completed years ago, denies changes in vision  Dental Exam: Completed in May 2017     Providers: Dr. Janeece Riggers, psychiatry; Vernona Rieger, PCP   I have personally reviewed and have noted: 1. The patient's medical and social history 2. Their use of  alcohol, tobacco or illicit drugs 3. Their current medications and supplements 4. The patient's functional ability including ADL's, fall risks, home safety risks and  hearing or visual impairment. 5. Diet and physical activities 6. Evidence for depression or mood disorder  Subjective:   Review of Systems:   Constitutional: Denies fever, malaise, fatigue, headache or abrupt weight changes.  HEENT: Denies eye pain, eye redness, ear pain, ringing in the ears, wax buildup, runny nose, bloody nose, or sore throat. He does report nasal congestion for the past 6 weeks. Respiratory: Denies difficulty breathing, shortness of breath. He's had a cough intermittently for several weeks, overall improving. Non productive.   Cardiovascular: Denies chest pain, chest tightness, palpitations or swelling in the hands or feet.  Gastrointestinal: Denies abdominal pain, bloating, constipation, diarrhea or blood in the stool.  GU: Denies urgency, frequency, pain with urination, burning sensation, blood in urine, odor or discharge. Musculoskeletal: Denies decrease in range of motion, difficulty with gait, muscle pain or joint pain and swelling. Chronic right shoulder pain, history of arthritis and scar tissue form multiple breaks as a child.  Skin: Denies redness, rashes, lesions or ulcercations.  Neurological: Denies dizziness, difficulty with memory, difficulty with speech or problems with balance and coordination.   No other specific complaints in a complete review of  systems (except as listed in HPI above).  Objective:  PE:   There were no vitals taken for this visit. Wt Readings from Last 3 Encounters:  11/21/15 246 lb (111.585 kg)  10/28/15 248 lb 12.8 oz (112.855 kg)  03/28/13 225 lb 12.8 oz (102.422 kg)    General: Appears their stated age, well developed, well nourished in NAD. Skin: Warm, dry and intact. No rashes, lesions or ulcerations noted. HEENT: Head: normal shape and size; Eyes: sclera  white, no icterus, conjunctiva pink, PERRLA and EOMs intact; Ears: Tm's gray and intact, normal light reflex; Nose: mucosa pink and moist, septum midline; Throat/Mouth: Teeth present, mucosa pink and moist, no exudate, lesions or ulcerations noted.  Neck: Normal range of motion. Neck supple, trachea midline. No massses, lumps or thyromegaly present.  Cardiovascular: Normal rate and rhythm. S1,S2 noted.  No murmur, rubs or gallops noted. No JVD or BLE edema. No carotid bruits noted. Pulmonary/Chest: Normal effort and positive vesicular breath sounds. No respiratory distress. No wheezes, rales or ronchi noted.  Abdomen: Soft and nontender. Normal bowel sounds, no bruits noted. No distention or masses noted. Liver, spleen and kidneys non palpable. Musculoskeletal: Normal range of motion. No signs of joint swelling. No difficulty with gait.  Neurological: Alert and oriented. Cranial nerves II-XII intact. Coordination normal. +DTRs bilaterally. Psychiatric: Managed per psychiatry, feels well managed.  BMET    Component Value Date/Time   NA 141 01/22/2016 0852   K 3.6 01/22/2016 0852   CL 110 01/22/2016 0852   CO2 25 01/22/2016 0852   GLUCOSE 121* 01/22/2016 0852   BUN 15 01/22/2016 0852   CREATININE 1.00 01/22/2016 0852   CALCIUM 9.1 01/22/2016 0852    Lipid Panel     Component Value Date/Time   CHOL 176 01/22/2016 0852   TRIG 68.0 01/22/2016 0852   HDL 36.50* 01/22/2016 0852   CHOLHDL 5 01/22/2016 0852   VLDL 13.6 01/22/2016 0852   LDLCALC 126* 01/22/2016 0852    CBC No results found for: WBC, RBC, HGB, HCT, PLT, MCV, MCH, MCHC, RDW, LYMPHSABS, MONOABS, EOSABS, BASOSABS  Hgb A1C Lab Results  Component Value Date   HGBA1C 5.3 01/22/2016      Assessment and Plan:   Medicare Annual Wellness Visit:  Diet: He endorses a healthy diet. Breakfast: Skips Lunch: Skips Dinner: Eggs, fruit, hamburger without the bun, vegetables Snacks: None Desserts: Occasionally Beverages:  Water, occasional Gatorade, energy drinks Physical activity: Plays disc golf, active. Depression/mood screen: Managed per psychiatry. Hearing: Intact to whispered voice Visual acuity: Grossly normal, no recent eye exam. ADLs: Capable Fall risk: None Home safety: Good Cognitive evaluation: Intact to orientation, naming, recall and repetition EOL planning: Full code/ I agree  Preventative Medicine: Tetanus up-to-date. No other immunizations required. Fair diet overall but discussed importance of regular mealtimes and to increase consumption of vegetables, fruit, lean protein, whole gr. Recommended regular exercise 5 days a week. Recommended annual eye exam. Exam unremarkable. Labs stable. Follow-up in one year for repeat physical.  Next appointment: 1 year

## 2016-01-28 NOTE — Patient Instructions (Addendum)
Your liver enzymes are elevated. I would like to recheck these in 3 months. Please notify your psychiatrist that your liver enzymes are elevated.  Complete lab work prior to leaving today. I will notify you of your results once received.   Nasal Congestion: Try using Flonase (fluticasone) nasal spray. Instill 2 sprays in each nostril once daily.   Also start Zyrtec at bedtime to help with congestion, throat drainage, etc.  Schedule a lab only appointment in 3 months.  Follow up in 1 year for repeat physical or sooner if needed.  It was a pleasure to see you today!

## 2016-01-28 NOTE — Assessment & Plan Note (Signed)
Tetanus up-to-date. No other immunizations required. Fair diet overall but discussed importance of regular mealtimes and to increase consumption of vegetables, fruit, lean protein, whole gr. Recommended regular exercise 5 days a week. Recommended annual eye exam. Exam unremarkable. Labs stable. Follow-up in one year for repeat physical.  I have personally reviewed and have noted: 1. The patient's medical and social history 2. Their use of alcohol, tobacco or illicit drugs 3. Their current medications and supplements 4. The patient's functional ability including ADL's, fall risks, home safety risks and  hearing or visual impairment. 5. Diet and physical activities 6. Evidence for depression or mood disorder

## 2016-01-29 LAB — LITHIUM LEVEL: Lithium Lvl: 0.6 mmol/L (ref 0.6–1.2)

## 2016-02-17 DIAGNOSIS — F319 Bipolar disorder, unspecified: Secondary | ICD-10-CM | POA: Diagnosis not present

## 2016-03-19 DIAGNOSIS — F319 Bipolar disorder, unspecified: Secondary | ICD-10-CM | POA: Diagnosis not present

## 2016-03-19 DIAGNOSIS — F3160 Bipolar disorder, current episode mixed, unspecified: Secondary | ICD-10-CM | POA: Diagnosis not present

## 2016-03-19 DIAGNOSIS — Z79899 Other long term (current) drug therapy: Secondary | ICD-10-CM | POA: Diagnosis not present

## 2016-04-22 ENCOUNTER — Other Ambulatory Visit: Payer: Self-pay | Admitting: Internal Medicine

## 2016-04-22 DIAGNOSIS — R748 Abnormal levels of other serum enzymes: Secondary | ICD-10-CM

## 2016-04-29 ENCOUNTER — Ambulatory Visit (INDEPENDENT_AMBULATORY_CARE_PROVIDER_SITE_OTHER): Payer: Medicare Other

## 2016-04-29 ENCOUNTER — Other Ambulatory Visit (INDEPENDENT_AMBULATORY_CARE_PROVIDER_SITE_OTHER): Payer: Medicare Other

## 2016-04-29 ENCOUNTER — Other Ambulatory Visit: Payer: Self-pay

## 2016-04-29 DIAGNOSIS — Z23 Encounter for immunization: Secondary | ICD-10-CM | POA: Diagnosis not present

## 2016-04-29 DIAGNOSIS — R748 Abnormal levels of other serum enzymes: Secondary | ICD-10-CM | POA: Diagnosis not present

## 2016-04-29 LAB — COMPREHENSIVE METABOLIC PANEL
ALBUMIN: 4.2 g/dL (ref 3.5–5.2)
ALK PHOS: 98 U/L (ref 39–117)
ALT: 39 U/L (ref 0–53)
AST: 22 U/L (ref 0–37)
BILIRUBIN TOTAL: 0.3 mg/dL (ref 0.2–1.2)
BUN: 10 mg/dL (ref 6–23)
CALCIUM: 9 mg/dL (ref 8.4–10.5)
CO2: 30 mEq/L (ref 19–32)
Chloride: 109 mEq/L (ref 96–112)
Creatinine, Ser: 0.9 mg/dL (ref 0.40–1.50)
GFR: 98.23 mL/min (ref >60.00)
GLUCOSE: 113 mg/dL — AB (ref 70–99)
Potassium: 3.9 mEq/L (ref 3.5–5.1)
SODIUM: 142 meq/L (ref 135–145)
Total Protein: 6.6 g/dL (ref 6.0–8.3)

## 2016-05-21 DIAGNOSIS — F319 Bipolar disorder, unspecified: Secondary | ICD-10-CM | POA: Diagnosis not present

## 2016-08-12 DIAGNOSIS — F319 Bipolar disorder, unspecified: Secondary | ICD-10-CM | POA: Diagnosis not present

## 2016-08-12 DIAGNOSIS — Z79899 Other long term (current) drug therapy: Secondary | ICD-10-CM | POA: Diagnosis not present

## 2016-08-22 ENCOUNTER — Encounter: Payer: Self-pay | Admitting: Emergency Medicine

## 2016-08-22 ENCOUNTER — Ambulatory Visit
Admission: EM | Admit: 2016-08-22 | Discharge: 2016-08-22 | Disposition: A | Discharge status: Home / Self Care | Payer: Medicare Other | Attending: Emergency Medicine | Admitting: Emergency Medicine

## 2016-08-22 DIAGNOSIS — L0231 Cutaneous abscess of buttock: Secondary | ICD-10-CM

## 2016-08-22 DIAGNOSIS — L03317 Cellulitis of buttock: Secondary | ICD-10-CM

## 2016-08-22 MED ORDER — SULFAMETHOXAZOLE-TRIMETHOPRIM 800-160 MG PO TABS: 1.0000 | ORAL_TABLET | Freq: Two times a day (BID) | ORAL | 0 refills | Status: AC

## 2016-08-22 MED ORDER — MUPIROCIN 2 % EX OINT: TOPICAL_OINTMENT | CUTANEOUS | 0 refills | Status: DC

## 2016-08-22 NOTE — ED Provider Notes (Signed)
[facility] ____________________________________________  Time seen: Approximately 9:45 AM  I have reviewed the triage vital signs and the nursing notes.   HISTORY  Chief Complaint Abscess   HPI Frederick [name] is a 41 y.o. male presents with wife at bedside for evaluation of redness, tenderness to right buttocks for the last 3 days. Patient denies insect bite, injury or foreign bodies. Patient reports his history of similar. Denies history of MRSA. Denies others in the home with similar. Patient reports the area is tender mild to moderately and mostly when directly touch. Denies any drainage. Reports has been applying warm compresses and soaking in the bathtub. Patient reports otherwise feels well. Denies recent antibiotic use. Patient states he has been trying to pop and squeeze the area but no drainage.  Denies chest pain, shortness of breath, abdominal pain, fevers, urinary or bowel changes, rectal pain, constipation or diarrhea. Reports abscesses in past treated well with oral Bactrim. States no antibiotic use recently.  Morrie Sheldonlark,Katherine Kendal, NP: PCP   Past Medical History:  Diagnosis Date  . Bipolar disorder [facility](HCC)     Patient Active Problem List   Diagnosis Date Noted  . Medicare annual wellness visit, subsequent 01/28/2016  . Back pain 10/28/2015  . Bipolar disorder, unspecified 01/04/2012    History reviewed. No pertinent surgical history.   No current facility-administered medications for this encounter.   Current Outpatient Prescriptions:  .  clonazePAM (KLONOPIN) 2 MG tablet, Take 1 tablet (2 mg total) by mouth 4 (four) times daily as needed for anxiety., Disp: 120 tablet, Rfl: 1 .  lithium carbonate (ESKALITH) 450 MG CR tablet, Take one tablet by mouth in the morning and two tablets by mouth every evening, Disp: 270 tablet, Rfl: 1 .  mupirocin ointment (BACTROBAN) 2 %, Apply three times a day for 5 days., Disp: 22 g, Rfl: 0 .  Oxcarbazepine  (TRILEPTAL) 300 MG tablet, Take one tablet each morning, and two at bedtime., Disp: 270 tablet, Rfl: 0 .  sulfamethoxazole-trimethoprim (BACTRIM DS,SEPTRA DS) 800-160 MG tablet, Take 1 tablet by mouth 2 (two) times daily., Disp: 20 tablet, Rfl: 0 .  Suvorexant (BELSOMRA PO), Take 20 mg by mouth at bedtime., Disp: , Rfl:   Allergies Alprazolam; Clindamycin; and Ziprasidone hcl  Family History  Problem Relation Age of Onset  . Bipolar disorder Mother   . Arthritis Father   . Bipolar disorder Brother     Social History Social History  Substance Use Topics  . Smoking status: Current Every Day Smoker    Packs/day: 0.25  . Smokeless tobacco: Never Used  . Alcohol use No    Review of Systems Constitutional: No fever/chills Eyes: No visual changes. ENT: No sore throat. Cardiovascular: Denies chest pain. Respiratory: Denies shortness of breath. Gastrointestinal: No abdominal pain.  No nausea, no vomiting.  No diarrhea.  No constipation. Genitourinary: Negative for dysuria. Musculoskeletal: Negative for back pain. Skin: Negative for rash. As above. Neurological: Negative for headaches, focal weakness or numbness.  10-point ROS otherwise negative.  ____________________________________________   PHYSICAL EXAM:  VITAL SIGNS: ED Triage Vitals  Enc Vitals Group     BP 08/22/16 0908 123/86     Pulse Rate 08/22/16 0908 77     Resp 08/22/16 0908 16     Temp 08/22/16 0908 97.8 F (36.6 C)     Temp Source 08/22/16 0908 Oral     SpO2 08/22/16 0908 100 %     Weight 08/22/16 0906 233 lb (105.7 kg)  Height 08/22/16 0906 6\' 2"  (1.88 m)     Head Circumference --      Peak Flow --      Pain Score 08/22/16 0908 5     Pain Loc --      Pain Edu? --      Excl. in GC? --   Spouse at bedside.  Constitutional: Alert and oriented. Well appearing and in no acute distress. Eyes: Conjunctivae are normal. PERRL. EOMI. ENT      Head: Normocephalic and atraumatic.      Mouth/Throat:  Mucous membranes are moist. Cardiovascular: Normal rate, regular rhythm. Grossly normal heart sounds.  Good peripheral circulation. Respiratory: Normal respiratory effort without tachypnea nor retractions. Breath sounds are clear and equal bilaterally. No wheezes/rales/rhonchi.. Gastrointestinal: Soft and nontender.  Musculoskeletal:  Ambulatory with steady gait.  Neurologic:  Normal speech and language. Speech is normal. No gait instability.  Skin:  Skin is warm, dry and intact. No rash noted. Except: Right buttocks area of 2 x 2.5 cm induration, no fluctuance, no pointing abscess, mild surrounding erythema, mild scabbing present at induration and patient reports he tried to squeeze the area to get out drainage, no foreign bodies visualized, mild tenderness to palpation. Psychiatric: Mood and affect are normal. Speech and behavior are normal. Patient exhibits appropriate insight and judgment   ___________________________________________   LABS (all labs ordered are listed, but only abnormal results are displayed)  Labs Reviewed - No data to display ____________________________________________ PROCEDURES Procedures    INITIAL IMPRESSION / ASSESSMENT AND PLAN / ED COURSE  Pertinent labs & imaging results that were available during my care of the patient were reviewed by me and considered in my medical decision making (see chart for details).  Well-appearing patient. No acute distress. Wife at bedside. Presents for evaluation of 3 days gradual onset of redness, tenderness and swelling to right buttock soft tissue. History of similar. Denies MRSA. Area of cellulitis with induration abscess, no fluctuance. No I&D recommended at this time. Will start patient on oral and topical antibiotic, encourage warm compresses, keep clean, and closely monitoring. Encouraged follow-up with PCP next week. Discussed strict follow-up and return parameters.Discussed indication, risks and benefits of medications  with patient.  Discussed follow up with Primary care physician this week. Discussed follow up and return parameters including no resolution or any worsening concerns. Patient verbalized understanding and agreed to plan.   ____________________________________________   FINAL CLINICAL IMPRESSION(S) / ED DIAGNOSES  Final diagnoses:  Cellulitis and abscess of buttock     Discharge Medication List as of 08/22/2016  9:50 AM    START taking these medications   Details  mupirocin ointment (BACTROBAN) 2 % Apply three times a day for 5 days., Normal    sulfamethoxazole-trimethoprim (BACTRIM DS,SEPTRA DS) 800-160 MG tablet Take 1 tablet by mouth 2 (two) times daily., Starting Sat 08/22/2016, Until Tue 09/01/2016, Normal        Note: This dictation was prepared with Dragon dictation along with smaller phrase technology. Any transcriptional errors that result from this process are unintentional.    Clinical Course       Renford Dills, NP 08/22/16 1004

## 2016-08-22 NOTE — ED Triage Notes (Signed)
Patient c/o abscess on his right buttock for the past 3 days. Patient denies fevers.

## 2016-08-22 NOTE — Discharge Instructions (Signed)
Take medication as prescribed. Rest. Drink plenty of fluids. Apply warm compresses. Keep clean.  Follow up with your primary care physician this week. Return to Urgent care for new or worsening concerns.

## 2016-09-29 ENCOUNTER — Telehealth: Payer: Self-pay | Admitting: Primary Care

## 2016-09-29 NOTE — Telephone Encounter (Signed)
Seems like he's stable for outpatient evaluation tomorrow as long as he's not having left sided chest pain with nausea, sweats, and fast heart rate.

## 2016-09-29 NOTE — Telephone Encounter (Signed)
Patient Name: Frederick [name] DOB: 07/31/1976 Initial Comment Caller states he is having pain in his sternum and ribs Nurse Assessment Nurse: Shelva MajesticWest, RN, Marchelle FolksAmanda Date/Time (Eastern Time): 09/29/2016 12:28:52 PM Confirm and document reason for call. If symptomatic, describe symptoms. ---Caller states he is having pain in his sternum and ribs on right side. Pain started a few days ago and is getting worse. Pain comes and goes. Does the patient have any new or worsening symptoms? ---Yes Will a triage be completed? ---Yes Related visit to physician within the last 2 weeks? ---No Does the PT have any chronic conditions? (i.e. diabetes, asthma, etc.) ---No Is this a behavioral health or substance abuse call? ---No Guidelines Guideline Title Affirmed Question Affirmed Notes Chest Pain [1] Intermittent chest pain or "angina" AND [2] increasing in severity or frequency (Exception: pains lasting a few seconds) Final Disposition User Go to ED Now ChadWest, RN, [facility]errals [facility] - ED Disagree/Comply: Danella Maiersomply

## 2016-09-29 NOTE — Telephone Encounter (Signed)
Spoken to patient and appointment has been schedule for 09/30/2016

## 2016-09-30 ENCOUNTER — Encounter: Payer: Self-pay | Admitting: Primary Care

## 2016-09-30 ENCOUNTER — Ambulatory Visit (INDEPENDENT_AMBULATORY_CARE_PROVIDER_SITE_OTHER): Payer: Medicare Other | Admitting: Primary Care

## 2016-09-30 VITALS — BP 142/90 | HR 72 | Temp 97.6°F | Ht 72.0 in | Wt 238.8 lb

## 2016-09-30 DIAGNOSIS — R1011 Right upper quadrant pain: Secondary | ICD-10-CM | POA: Diagnosis not present

## 2016-09-30 DIAGNOSIS — I4901 Ventricular fibrillation: Secondary | ICD-10-CM | POA: Diagnosis not present

## 2016-09-30 DIAGNOSIS — R1013 Epigastric pain: Secondary | ICD-10-CM

## 2016-09-30 DIAGNOSIS — K219 Gastro-esophageal reflux disease without esophagitis: Secondary | ICD-10-CM

## 2016-09-30 LAB — HEPATIC FUNCTION PANEL
ALBUMIN: 4.5 g/dL (ref 3.5–5.2)
ALT: 39 U/L (ref 0–53)
AST: 23 U/L (ref 0–37)
Alkaline Phosphatase: 82 U/L (ref 39–117)
BILIRUBIN DIRECT: 0.1 mg/dL (ref 0.0–0.3)
TOTAL PROTEIN: 6.8 g/dL (ref 6.0–8.3)
Total Bilirubin: 0.4 mg/dL (ref 0.2–1.2)

## 2016-09-30 LAB — CBC WITH DIFFERENTIAL/PLATELET
BASOS ABS: 0 10*3/uL (ref 0.0–0.1)
Basophils Relative: 0.5 % (ref 0.0–3.0)
EOS ABS: 0.1 10*3/uL (ref 0.0–0.7)
Eosinophils Relative: 1.5 % (ref 0.0–5.0)
HCT: 43.6 % (ref 39.0–52.0)
Hemoglobin: 14.5 g/dL (ref 13.0–17.0)
LYMPHS ABS: 2.3 10*3/uL (ref 0.7–4.0)
Lymphocytes Relative: 32.1 % (ref 12.0–46.0)
MCHC: 33.3 g/dL (ref 30.0–36.0)
MCV: 92.3 fl (ref 78.0–100.0)
Monocytes Absolute: 0.6 10*3/uL (ref 0.1–1.0)
Monocytes Relative: 8.1 % (ref 3.0–12.0)
NEUTROS ABS: 4.1 10*3/uL (ref 1.4–7.7)
NEUTROS PCT: 57.8 % (ref 43.0–77.0)
PLATELETS: 247 10*3/uL (ref 150.0–400.0)
RBC: 4.73 Mil/uL (ref 4.22–5.81)
RDW: 13.1 % (ref 11.5–15.5)
WBC: 7.1 10*3/uL (ref 4.0–10.5)

## 2016-09-30 LAB — LIPASE: Lipase: 145 U/L — ABNORMAL HIGH (ref 11.0–59.0)

## 2016-09-30 NOTE — Patient Instructions (Signed)
Complete lab work prior to leaving today. I will notify you of your results once received.   Stop by the front desk and [facility]. I will notify you of your results once received.  Start ranitidine (Zantac) 150 mg tablets for acid reflux. Take 1 tablet by mouth once daily, may take 1 tablet twice daily if no improvement. Do this for 1 month. Take a look at trigger foods for acid reflux.  It was a pleasure to see you today!   Food Choices for Gastroesophageal Reflux Disease, Adult When you have gastroesophageal reflux disease (GERD), the foods you eat and your eating habits are very important. Choosing the right foods can help ease your discomfort. What guidelines do I need to follow?  Choose fruits, vegetables, whole grains, and low-fat dairy products.  Choose low-fat meat, fish, and poultry.  Limit fats such as oils, salad dressings, butter, nuts, and avocado.  Keep a food diary. This helps you identify foods that cause symptoms.  Avoid foods that cause symptoms. These may be different for everyone.  Eat small meals often instead of 3 large meals a day.  Eat your meals slowly, in a place where you are relaxed.  Limit fried foods.  Cook foods using methods other than frying.  Avoid drinking alcohol.  Avoid drinking large amounts of liquids with your meals.  Avoid bending over or lying down until 2-3 hours after eating. What foods are not recommended? These are some foods and drinks that may make your symptoms worse: Vegetables  Tomatoes. Tomato juice. Tomato and spaghetti sauce. Chili peppers. Onion and garlic. Horseradish. Fruits  Oranges, grapefruit, and lemon (fruit and juice). Meats  High-fat meats, fish, and poultry. This includes hot dogs, ribs, ham, sausage, salami, and bacon. Dairy  Whole milk and chocolate milk. Sour cream. Cream. Butter. Ice cream. Cream cheese. Drinks  Coffee and tea. Bubbly (carbonated) drinks or energy  drinks. Condiments  Hot sauce. Barbecue sauce. Sweets/Desserts  Chocolate and cocoa. Donuts. Peppermint and spearmint. Fats and Oils  High-fat foods. This includes [place] fries and potato chips. Other  Vinegar. Strong spices. This includes black pepper, white pepper, red pepper, cayenne, curry powder, cloves, ginger, and chili powder. The items listed above may not be a complete list of foods and drinks to avoid. Contact your dietitian for more information.  This information is not intended to replace advice given to you by your health care provider. Make sure you discuss any questions you have with your health care provider. Document Released: 02/02/2012 Document Revised: 01/09/2016 Document Reviewed: 06/07/2013 Elsevier Interactive Patient Education  2017 [employer]

## 2016-09-30 NOTE — Progress Notes (Signed)
Pre visit review using our clinic review tool, if applicable. No additional management support is needed unless otherwise documented below in the visit note. 

## 2016-09-30 NOTE — Progress Notes (Signed)
Subjective:    Patient ID: Frederick [name], male    DOB: 04/12/1976, 41 y.o.   MRN: [id number]  HPI  Mr. Frederick [name] is a 41 year old male who presents today with a chief complaint of epigastric pain. His pain is located just distal to the sternum. During panic attacks he's noticed a "folding sensation" under the sternum with pressure. He's also noticed RUQ abdominal pain that will radiate around to his right thoracic back.   He first noticed these symptoms for 1+ years with an increase in symptoms yesterday. His pain is not related to eating or drinking. He denies nausea, vomiting, diarrhea, fevers, urinary symptoms. He does play disc golf often and lifts his 60 pound bag. He also rotates his body to the right when throwing the disc. He does have a history of GERD and has noticed esophageal burning recently. He's been taking Tums for the past 2 days with improvement.   Review of Systems  Constitutional: Negative for fatigue and fever.  Respiratory: Negative for shortness of breath.   Cardiovascular: Negative for chest pain.  Gastrointestinal: Positive for abdominal pain. Negative for constipation, diarrhea, nausea and vomiting.  Genitourinary: Negative for dysuria and hematuria.       Past Medical History:  Diagnosis Date  . Bipolar disorder [facility](HCC)      Social History   Social History  . Marital status: Married    Spouse name: N/A  . Number of children: N/A  . Years of education: N/A   Occupational History  . Not on file.   Social History Main Topics  . Smoking status: Current Every Day Smoker    Packs/day: 0.25  . Smokeless tobacco: Never Used  . Alcohol use No  . Drug use: No  . Sexual activity: Not on file   Other Topics Concern  . Not on file   Social History Narrative   Married.   2 children.   Disabled.   Enjoys playing disc golf.     No past surgical history on file.  Family History  Problem Relation Age of Onset  . Bipolar disorder Mother   . Arthritis  Father   . Bipolar disorder Brother     Allergies  Allergen Reactions  . Alprazolam Itching and Nausea And Vomiting  . Clindamycin Hives  . Ziprasidone Hcl Itching    Current Outpatient Prescriptions on File Prior to Visit  Medication Sig Dispense Refill  . clonazePAM (KLONOPIN) 2 MG tablet Take 1 tablet (2 mg total) by mouth 4 (four) times daily as needed for anxiety. 120 tablet 1  . lithium carbonate (ESKALITH) 450 MG CR tablet Take one tablet by mouth in the morning and two tablets by mouth every evening 270 tablet 1  . mupirocin ointment (BACTROBAN) 2 % Apply three times a day for 5 days. 22 g 0  . Oxcarbazepine (TRILEPTAL) 300 MG tablet Take one tablet each morning, and two at bedtime. 270 tablet 0  . Suvorexant (BELSOMRA PO) Take 20 mg by mouth at bedtime.     No current facility-administered medications on file prior to visit.     BP (!) 142/90   Pulse 72   Temp 97.6 F (36.4 C) (Oral)   Ht 6' (1.829 m)   Wt 238 lb 12.8 oz (108.3 kg)   SpO2 98%   BMI 32.39 kg/m    Objective:   Physical Exam  Constitutional: He appears well-nourished. He does not appear ill.  Neck: Neck supple.  Cardiovascular: Normal rate  and regular rhythm.   Pulmonary/Chest: Effort normal and breath sounds normal.  Abdominal: Soft. Normal appearance and bowel sounds are normal. There is tenderness in the right upper quadrant, right lower quadrant and epigastric area.  Skin: Skin is warm and dry.          Assessment & Plan:

## 2016-09-30 NOTE — Assessment & Plan Note (Signed)
Chronic, worse over last 24 hours. Check hepatic function panel, CBC, lipase today as he also has epigastric pain. Abdominal ultrasound ordered for evaluation of gallbladder, liver, presence of hernia. If all testing unremarkable, consider MSK as cause given activities with disc golf.

## 2016-09-30 NOTE — Assessment & Plan Note (Signed)
History of for years, takes Tums occasionally PRN. Symptoms today could be related. Will have him start Zantac 150 mg once daily x 1 month, increase to BID if no improvement.

## 2016-10-01 ENCOUNTER — Ambulatory Visit: Payer: Self-pay | Admitting: Primary Care

## 2016-10-02 NOTE — Addendum Note (Signed)
Addended by: Tawnya CrookSAMBATH, Annalisia Ingber on: 10/02/2016 08:00 AM   Modules accepted: Orders

## 2016-10-05 ENCOUNTER — Ambulatory Visit: Payer: Medicare Other

## 2016-10-05 ENCOUNTER — Encounter: Payer: Self-pay | Admitting: Primary Care

## 2016-10-06 DIAGNOSIS — F319 Bipolar disorder, unspecified: Secondary | ICD-10-CM | POA: Diagnosis not present

## 2016-10-07 ENCOUNTER — Ambulatory Visit
Admission: RE | Admit: 2016-10-07 | Discharge: 2016-10-07 | Disposition: A | Discharge status: Home / Self Care | Payer: Medicare Other | Source: Ambulatory Visit | Attending: Primary Care | Admitting: Primary Care

## 2016-10-07 DIAGNOSIS — R1011 Right upper quadrant pain: Secondary | ICD-10-CM | POA: Diagnosis not present

## 2016-10-07 DIAGNOSIS — K760 Fatty (change of) liver, not elsewhere classified: Secondary | ICD-10-CM | POA: Diagnosis not present

## 2016-10-07 DIAGNOSIS — R1013 Epigastric pain: Secondary | ICD-10-CM | POA: Insufficient documentation

## 2016-10-09 ENCOUNTER — Other Ambulatory Visit (INDEPENDENT_AMBULATORY_CARE_PROVIDER_SITE_OTHER): Payer: Medicare Other

## 2016-10-09 ENCOUNTER — Other Ambulatory Visit: Payer: Self-pay | Admitting: Primary Care

## 2016-10-09 DIAGNOSIS — R1010 Upper abdominal pain, unspecified: Secondary | ICD-10-CM

## 2016-10-09 LAB — HEPATIC FUNCTION PANEL
ALT: 39 U/L (ref 0–53)
AST: 24 U/L (ref 0–37)
Albumin: 4.5 g/dL (ref 3.5–5.2)
Alkaline Phosphatase: 77 U/L (ref 39–117)
BILIRUBIN TOTAL: 0.4 mg/dL (ref 0.2–1.2)
Bilirubin, Direct: 0.1 mg/dL (ref 0.0–0.3)
Total Protein: 6.7 g/dL (ref 6.0–8.3)

## 2016-10-09 LAB — CBC WITH DIFFERENTIAL/PLATELET
BASOS PCT: 0.5 % (ref 0.0–3.0)
Basophils Absolute: 0 10*3/uL (ref 0.0–0.1)
EOS PCT: 1.5 % (ref 0.0–5.0)
Eosinophils Absolute: 0.1 10*3/uL (ref 0.0–0.7)
HCT: 43.5 % (ref 39.0–52.0)
Hemoglobin: 14.6 g/dL (ref 13.0–17.0)
LYMPHS ABS: 2.7 10*3/uL (ref 0.7–4.0)
Lymphocytes Relative: 33.7 % (ref 12.0–46.0)
MCHC: 33.6 g/dL (ref 30.0–36.0)
MCV: 92.1 fl (ref 78.0–100.0)
MONO ABS: 0.7 10*3/uL (ref 0.1–1.0)
Monocytes Relative: 8.1 % (ref 3.0–12.0)
NEUTROS ABS: 4.5 10*3/uL (ref 1.4–7.7)
Neutrophils Relative %: 56.2 % (ref 43.0–77.0)
PLATELETS: 241 10*3/uL (ref 150.0–400.0)
RBC: 4.73 Mil/uL (ref 4.22–5.81)
RDW: 13.4 % (ref 11.5–15.5)
WBC: 8.1 10*3/uL (ref 4.0–10.5)

## 2016-10-09 LAB — LIPASE: LIPASE: 61 U/L — AB (ref 11.0–59.0)

## 2016-10-14 ENCOUNTER — Telehealth: Payer: Self-pay | Admitting: Primary Care

## 2016-10-14 DIAGNOSIS — R748 Abnormal levels of other serum enzymes: Secondary | ICD-10-CM

## 2016-10-14 NOTE — Telephone Encounter (Signed)
Pt returned call about labs  ° °

## 2016-10-15 NOTE — Telephone Encounter (Signed)
He is to continue taking Zantac for the next 4 weeks. Also would like to repeat his labs in 4 weeks, please set him up for a lab only appointment. Have him call sooner if pain comes back like it was before.

## 2016-10-15 NOTE — Telephone Encounter (Signed)
Spoken to patient. He stated that he started to take Zantac and that's been helping, feels like it went away. However, sometimes there still some pain at that area. Patient stated that he does not drink alcohol.

## 2016-10-19 NOTE — Telephone Encounter (Signed)
Spoken and notified patient of Kate's comments. Patient verbalized understanding.  Lab appt on 11/11/2016

## 2016-11-11 ENCOUNTER — Other Ambulatory Visit: Payer: Medicare Other

## 2017-01-04 DIAGNOSIS — Z79899 Other long term (current) drug therapy: Secondary | ICD-10-CM | POA: Diagnosis not present

## 2017-01-04 DIAGNOSIS — F319 Bipolar disorder, unspecified: Secondary | ICD-10-CM | POA: Diagnosis not present

## 2017-01-18 ENCOUNTER — Other Ambulatory Visit: Payer: Self-pay | Admitting: Primary Care

## 2017-01-18 DIAGNOSIS — Z0000 Encounter for general adult medical examination without abnormal findings: Secondary | ICD-10-CM

## 2017-01-18 DIAGNOSIS — R739 Hyperglycemia, unspecified: Secondary | ICD-10-CM

## 2017-01-18 DIAGNOSIS — E785 Hyperlipidemia, unspecified: Secondary | ICD-10-CM

## 2017-01-29 ENCOUNTER — Ambulatory Visit: Payer: Medicare Other

## 2017-01-29 ENCOUNTER — Other Ambulatory Visit: Payer: Medicare Other

## 2017-02-02 ENCOUNTER — Encounter: Payer: Medicare Other | Admitting: Primary Care

## 2017-02-05 ENCOUNTER — Ambulatory Visit (INDEPENDENT_AMBULATORY_CARE_PROVIDER_SITE_OTHER): Payer: Medicare Other

## 2017-02-05 VITALS — BP 110/80 | HR 73 | Temp 97.8°F | Ht 73.0 in | Wt 224.0 lb

## 2017-02-05 DIAGNOSIS — R739 Hyperglycemia, unspecified: Secondary | ICD-10-CM | POA: Diagnosis not present

## 2017-02-05 DIAGNOSIS — R748 Abnormal levels of other serum enzymes: Secondary | ICD-10-CM | POA: Diagnosis not present

## 2017-02-05 DIAGNOSIS — Z0000 Encounter for general adult medical examination without abnormal findings: Secondary | ICD-10-CM

## 2017-02-05 DIAGNOSIS — E785 Hyperlipidemia, unspecified: Secondary | ICD-10-CM

## 2017-02-05 LAB — COMPREHENSIVE METABOLIC PANEL
ALBUMIN: 4.6 g/dL (ref 3.5–5.2)
ALK PHOS: 88 U/L (ref 39–117)
ALT: 24 U/L (ref 0–53)
AST: 21 U/L (ref 0–37)
BILIRUBIN TOTAL: 0.6 mg/dL (ref 0.2–1.2)
BUN: 13 mg/dL (ref 6–23)
CO2: 26 mEq/L (ref 19–32)
CREATININE: 0.99 mg/dL (ref 0.40–1.50)
Calcium: 9.5 mg/dL (ref 8.4–10.5)
Chloride: 108 mEq/L (ref 96–112)
GFR: 87.68 mL/min (ref >60.00)
GLUCOSE: 101 mg/dL — AB (ref 70–99)
Potassium: 4.4 mEq/L (ref 3.5–5.1)
SODIUM: 141 meq/L (ref 135–145)
TOTAL PROTEIN: 6.8 g/dL (ref 6.0–8.3)

## 2017-02-05 LAB — LIPID PANEL
CHOLESTEROL: 180 mg/dL (ref 0–200)
HDL: 38 mg/dL — ABNORMAL LOW (ref >39.00)
LDL Cholesterol: 123 mg/dL — ABNORMAL HIGH (ref 0–99)
NONHDL: 141.67
Total CHOL/HDL Ratio: 5
Triglycerides: 95 mg/dL (ref 0.0–149.0)
VLDL: 19 mg/dL (ref 0.0–40.0)

## 2017-02-05 LAB — LIPASE: Lipase: 33 U/L (ref 11.0–59.0)

## 2017-02-05 LAB — HEMOGLOBIN A1C: HEMOGLOBIN A1C: 5.6 % (ref 4.6–6.5)

## 2017-02-05 NOTE — Progress Notes (Signed)
Subjective:   Frederick [name] is a 41 y.o. male who presents for Medicare Annual/Subsequent preventive examination.  Review of Systems:  N/A       Objective:    Vitals: BP 110/80 (BP Location: Right Arm, Patient Position: Sitting, Cuff Size: Normal)   Pulse 73   Temp 97.8 F (36.6 C) (Oral)   Ht 6\' 1"  (1.854 m)   Wt 224 lb (101.6 kg)   SpO2 98%   BMI 29.55 kg/m   Body mass index is 29.55 kg/m.  Tobacco History  Smoking Status  . Current Some Day Smoker  . Packs/day: 0.00  . Types: Cigarettes  Smokeless Tobacco  . Never Used    Comment: avg 1 cig per day     Ready to quit: No Counseling given: No   Past Medical History:  Diagnosis Date  . Bipolar disorder (HCC)    History reviewed. No pertinent surgical history. Family History  Problem Relation Age of Onset  . Bipolar disorder Mother   . Arthritis Father   . Bipolar disorder Brother    History  Sexual Activity  . Sexual activity: Not on file    Outpatient Encounter Prescriptions as of 02/05/2017  Medication Sig  . lithium carbonate (ESKALITH) 450 MG CR tablet Take one tablet by mouth in the morning and two tablets by mouth every evening  . Oxcarbazepine (TRILEPTAL) 300 MG tablet Take one tablet each morning, and two at bedtime.  . ranitidine (ZANTAC) 150 MG capsule Take 150 mg by mouth 2 (two) times daily.  . TRAZODONE HCL PO Take by mouth as needed.  . [DISCONTINUED] mupirocin ointment (BACTROBAN) 2 % Apply three times a day for 5 days.  . clonazePAM (KLONOPIN) 2 MG tablet Take 1 tablet (2 mg total) by mouth 4 (four) times daily as needed for anxiety.  . [DISCONTINUED] Suvorexant (BELSOMRA PO) Take 20 mg by mouth at bedtime.   No facility-administered encounter medications on file as of 02/05/2017.     Activities of Daily Living In your present state of health, do you have any difficulty performing the following activities: 02/05/2017  Hearing? N  Vision? Y  Difficulty concentrating or making  decisions? Y  Walking or climbing stairs? Y  Dressing or bathing? N  Doing errands, shopping? N  Preparing Food and eating ? N  Using the Toilet? N  In the past six months, have you accidently leaked urine? N  Do you have problems with loss of bowel control? N  Managing your Medications? Y  Managing your Finances? Y  Housekeeping or managing your Housekeeping? N  Some recent data might be hidden    Patient Care Team: Doreene Nestlark, Katherine K, NP as PCP - General (Internal Medicine)   Assessment:     Hearing Screening   125Hz  250Hz  500Hz  1000Hz  2000Hz  3000Hz  4000Hz  6000Hz  8000Hz   Right ear:   40 40 40  40    Left ear:   40 40 40  40      Visual Acuity Screening   Right eye Left eye Both eyes  Without correction: 20/15 20/13-1 20/15   With correction:       Exercise Activities and Dietary recommendations Current Exercise Habits: Home exercise routine, Type of exercise: walking;Other - see comments (disc golf ), Time (Minutes): > 60, Frequency (Times/Week): 7, Weekly Exercise (Minutes/Week): 0, Intensity: Mild, Exercise limited by: None identified  Goals    . weight management          Target weight is  200lbs. Starting 02/05/17, I will continue to play disc golf for at least 2 hours or more as schedule permits.       Fall Risk Fall Risk  02/05/2017 01/28/2016  Falls in the past year? Yes No  Number falls in past yr: 1 -  Injury with Fall? Yes -   Depression Screen PHQ 2/9 Scores 02/05/2017 01/28/2016  PHQ - 2 Score 1 0    Cognitive Function MMSE - Mini Mental State Exam 02/05/2017  Orientation to time 5  Orientation to Place 5  Registration 3  Attention/ Calculation 0  Recall 3  Language- name 2 objects 0  Language- repeat 1  Language- follow 3 step command 3  Language- read & follow direction 0  Write a sentence 0  Copy design 0  Total score 20     PLEASE NOTE: A Mini-Cog screen was completed. Maximum score is 20. A value of 0 denotes this part of Folstein MMSE was  not completed or the patient failed this part of the Mini-Cog screening.   Mini-Cog Screening Orientation to Time - Max 5 pts Orientation to Place - Max 5 pts Registration - Max 3 pts Recall - Max 3 pts Language Repeat - Max 1 pts Language Follow 3 Step Command - Max 3 pts     Immunization History  Administered Date(s) Administered  . Influenza,inj,Quad PF,36+ Mos 04/29/2016   Screening Tests Health Maintenance  Topic Date Due  . TETANUS/TDAP  02/06/2027 (Originally 01/23/1993)  . HIV Screening  02/05/2099 (Originally 01/23/1989)  . INFLUENZA VACCINE  03/17/2017      Plan:     I have personally reviewed and addressed the Medicare Annual Wellness questionnaire and have noted the following in the patient's chart:  A. Medical and social history B. Use of alcohol, tobacco or illicit drugs  C. Current medications and supplements D. Functional ability and status E.  Nutritional status F.  Physical activity G. Advance directives H. List of other physicians I.  Hospitalizations, surgeries, and ER visits in previous 12 months J.  Vitals K. Screenings to include hearing, vision, cognitive, depression L. Referrals and appointments - none  In addition, I have reviewed and discussed with patient certain preventive protocols, quality metrics, and best practice recommendations. A written personalized care plan for preventive services as well as general preventive health recommendations were provided to patient.  See attached scanned questionnaire for additional information.   Signed,   Randa Evens, MHA, BS, LPN Health Coach

## 2017-02-05 NOTE — Progress Notes (Signed)
Pre visit review using our clinic review tool, if applicable. No additional management support is needed unless otherwise documented below in the visit note. 

## 2017-02-05 NOTE — Patient Instructions (Signed)
Mr. Frederick [name] , Thank you for taking time to come for your Medicare Wellness Visit. I appreciate your ongoing commitment to your health goals. Please review the following plan we discussed and let me know if I can assist you in the future.   These are the goals we discussed: Goals    . weight management          Target weight is 200lbs. Starting 02/05/17, I will continue to play disc golf for at least 2 hours or more as schedule permits.        This is a list of the screening recommended for you and due dates:  Health Maintenance  Topic Date Due  . Tetanus Vaccine  02/06/2027*  . HIV Screening  02/05/2099*  . Flu Shot  03/17/2017  *Topic was postponed. The date shown is not the original due date.   Preventive Care for Adults  A healthy lifestyle and preventive care can promote health and wellness. Preventive health guidelines for adults include the following key practices.  . A routine yearly physical is a good way to check with your health care provider about your health and preventive screening. It is a chance to share any concerns and updates on your health and to receive a thorough exam.  . Visit your dentist for a routine exam and preventive care every 6 months. Brush your teeth twice a day and floss once a day. Good oral hygiene prevents tooth decay and gum disease.  . The frequency of eye exams is based on your age, health, family medical history, use  of contact lenses, and other factors. Follow your health care provider's ecommendations for frequency of eye exams.  . Eat a healthy diet. Foods like vegetables, fruits, whole grains, low-fat dairy products, and lean protein foods contain the nutrients you need without too many calories. Decrease your intake of foods high in solid fats, added sugars, and salt. Eat the right amount of calories for you. Get information about a proper diet from your health care provider, if necessary.  . Regular physical exercise is one of the most  important things you can do for your health. Most adults should get at least 150 minutes of moderate-intensity exercise (any activity that increases your heart rate and causes you to sweat) each week. In addition, most adults need muscle-strengthening exercises on 2 or more days a week.  Silver Sneakers may be a benefit available to you. To determine eligibility, you may visit the website: www.silversneakers.com or contact program at (541)538-00231-9371730903 Mon-Fri between 8AM-8PM.   . Maintain a healthy weight. The body mass index (BMI) is a screening tool to identify possible weight problems. It provides an estimate of body fat based on height and weight. Your health care provider can find your BMI and can help you achieve or maintain a healthy weight.   For adults 20 years and older: ? A BMI below 18.5 is considered underweight. ? A BMI of 18.5 to 24.9 is normal. ? A BMI of 25 to 29.9 is considered overweight. ? A BMI of 30 and above is considered obese.   . Maintain normal blood lipids and cholesterol levels by exercising and minimizing your intake of saturated fat. Eat a balanced diet with plenty of fruit and vegetables. Blood tests for lipids and cholesterol should begin at age 43 and be repeated every 5 years. If your lipid or cholesterol levels are high, you are over 50, or you are at high risk for heart disease, you may  need your cholesterol levels checked more frequently. Ongoing high lipid and cholesterol levels should be treated with medicines if diet and exercise are not working.  . If you smoke, find out from your health care provider how to quit. If you do not use tobacco, please do not start.  . If you choose to drink alcohol, please do not consume more than 2 drinks per day. One drink is considered to be 12 ounces (355 mL) of beer, 5 ounces (148 mL) of wine, or 1.5 ounces (44 mL) of liquor.  . If you are 54-65 years old, ask your health care provider if you should take aspirin to prevent  strokes.  . Use sunscreen. Apply sunscreen liberally and repeatedly throughout the day. You should seek shade when your shadow is shorter than you. Protect yourself by wearing long sleeves, pants, a wide-brimmed hat, and sunglasses year round, whenever you are outdoors.  . Once a month, do a whole body skin exam, using a mirror to look at the skin on your back. Tell your health care provider of new moles, moles that have irregular borders, moles that are larger than a pencil eraser, or moles that have changed in shape or color.

## 2017-02-05 NOTE — Progress Notes (Signed)
I reviewed health advisor's note, was available for consultation, and agree with documentation and plan.  

## 2017-02-05 NOTE — Progress Notes (Signed)
PCP notes:   Health maintenance:  Tetanus - postponed/insurance HIV screening - declined  Abnormal screenings:   Fall risk - hx of fall with injury and medical treatment Depression score: 1  Patient concerns:   None  Nurse concerns:  None  Next PCP appt:   02/08/17 @ 1145

## 2017-02-08 ENCOUNTER — Telehealth: Payer: Self-pay | Admitting: Primary Care

## 2017-02-08 ENCOUNTER — Encounter: Payer: Medicare Other | Admitting: Primary Care

## 2017-02-08 NOTE — Telephone Encounter (Signed)
Please reschedule him for a general follow up visit in 6 months, no fee please.

## 2017-02-08 NOTE — Telephone Encounter (Signed)
Patient did not come in for their appointment today FOR CPE  Please let me know if patient needs to be contacted immediately for follow up or no follow up needed. Do you want to charge the NSF?

## 2017-02-10 NOTE — Telephone Encounter (Signed)
Pt rescheduled for 07/16 already

## 2017-03-01 ENCOUNTER — Encounter: Payer: Self-pay | Admitting: Primary Care

## 2017-03-01 ENCOUNTER — Ambulatory Visit (INDEPENDENT_AMBULATORY_CARE_PROVIDER_SITE_OTHER): Payer: Medicare Other | Admitting: Primary Care

## 2017-03-01 DIAGNOSIS — F3177 Bipolar disorder, in partial remission, most recent episode mixed: Secondary | ICD-10-CM

## 2017-03-01 DIAGNOSIS — K219 Gastro-esophageal reflux disease without esophagitis: Secondary | ICD-10-CM | POA: Diagnosis not present

## 2017-03-01 DIAGNOSIS — R1011 Right upper quadrant pain: Secondary | ICD-10-CM | POA: Diagnosis not present

## 2017-03-01 NOTE — Patient Instructions (Signed)
Stop taking Zantac to see if your acid reflux symptoms return. Please message me if this dose occur.  Please message me if you're requiring Tums more than twice weekly.  Start exercising. You should be getting 150 minutes of moderate intensity exercise weekly.  Increase consumption of vegetables, fruit, whole grains, water.  Ensure you are consuming 64 ounces of water daily.  Follow up in 1 year for your annual exam or sooner if needed.  It was a pleasure to see you today!

## 2017-03-01 NOTE — Progress Notes (Signed)
Subjective:    Patient ID: Frederick [name], male    DOB: September 24, 1975, 41 y.o.   MRN: [id number]  HPI  Frederick [name] is a 41 year old male who presents today for MWV Part 2. He saw our health advisor on 02/05/17.He has no complaints today.  1) Bipolar Disorder: Currently managed on Clonazepam,Trileptal 300 mg and Lithium 450 mg. Following with psychiatry every 2 months. Feeling well managed. He denies SI/HI.  2) GERD: Currently managed on Zantac 150 mg twice daily. He was previously managed on "a yellow pill" for acid reflux 10 years ago. He took this for 30 days with complete resolve of his acid reflux. He has no symptoms with Zantac now. He's not tried holding his Zantac to see if symptoms resumed.  Review of Systems  HENT: Negative for rhinorrhea.   Respiratory: Negative for cough and shortness of breath.   Cardiovascular: Negative for chest pain.  Gastrointestinal: Negative for abdominal pain.       No GERD  Musculoskeletal: Negative for arthralgias and myalgias.  Skin: Negative for rash.  Neurological: Negative for dizziness, numbness and headaches.  Psychiatric/Behavioral:       Feels well managed per psychiatry       Past Medical History:  Diagnosis Date  . Bipolar disorder [facility])      Social History   Social History  . Marital status: Married    Spouse name: N/A  . Number of children: N/A  . Years of education: N/A   Occupational History  . Not on file.   Social History Main Topics  . Smoking status: Former Smoker    Packs/day: 0.00    Types: Cigarettes  . Smokeless tobacco: Never Used     Comment: avg 1 cig per day  . Alcohol use No  . Drug use: No  . Sexual activity: Not on file   Other Topics Concern  . Not on file   Social History Narrative   Married.   2 children.   Disabled.   Enjoys playing disc golf.     No past surgical history on file.  Family History  Problem Relation Age of Onset  . Bipolar disorder Mother   . Arthritis Father   .  Bipolar disorder Brother     Allergies  Allergen Reactions  . Alprazolam Itching and Nausea And Vomiting  . Clindamycin Hives  . Ziprasidone Hcl Itching    Current Outpatient Prescriptions on File Prior to Visit  Medication Sig Dispense Refill  . lithium carbonate (ESKALITH) 450 MG CR tablet Take one tablet by mouth in the morning and two tablets by mouth every evening 270 tablet 1  . Oxcarbazepine (TRILEPTAL) 300 MG tablet Take one tablet each morning, and two at bedtime. 270 tablet 0  . ranitidine (ZANTAC) 150 MG capsule Take 150 mg by mouth 2 (two) times daily.    . clonazePAM (KLONOPIN) 2 MG tablet Take 1 tablet (2 mg total) by mouth 4 (four) times daily as needed for anxiety. 120 tablet 1   No current facility-administered medications on file prior to visit.     BP 126/80   Pulse 69   Temp 97.8 F (36.6 C) (Oral)   Ht 6\' 1"  (1.854 m)   Wt 223 lb 6.4 oz (101.3 kg)   SpO2 96%   BMI 29.47 kg/m    Objective:   Physical Exam  Constitutional: He is oriented to person, place, and time. He appears well-nourished.  HENT:  Right Ear: Tympanic membrane  and ear canal normal.  Left Ear: Tympanic membrane and ear canal normal.  Nose: Nose normal. Right sinus exhibits no maxillary sinus tenderness and no frontal sinus tenderness. Left sinus exhibits no maxillary sinus tenderness and no frontal sinus tenderness.  Mouth/Throat: Oropharynx is clear and moist.  Eyes: Pupils are equal, round, and reactive to light. Conjunctivae and EOM are normal.  Neck: Neck supple. Carotid bruit is not present. No thyromegaly present.  Cardiovascular: Normal rate, regular rhythm and normal heart sounds.   Pulmonary/Chest: Effort normal and breath sounds normal. He has no wheezes. He has no rales.  Abdominal: Soft. Bowel sounds are normal. There is no tenderness.  Musculoskeletal: Normal range of motion.  Small bony prominence to right dorsal foot, mid way. Nontender no erythema.  Neurological: He is  alert and oriented to person, place, and time. He has normal reflexes. No cranial nerve deficit.  Skin: Skin is warm and dry.  Psychiatric: He has a normal mood and affect.          Assessment & Plan:

## 2017-03-01 NOTE — Assessment & Plan Note (Signed)
No symptoms on Zantac. Will have him hold Zantac to see if symptoms return. Discussed to use Tums sparingly if symptoms returned. Consider 4-6 week trial of PPI if symptoms do return.

## 2017-03-01 NOTE — Assessment & Plan Note (Signed)
This has resolved since Zantac use. LFTs have returned to normal.

## 2017-03-01 NOTE — Assessment & Plan Note (Signed)
Following with psychiatry, feels well managed. Denies SI/HI.

## 2017-03-02 ENCOUNTER — Encounter: Payer: Self-pay | Admitting: Primary Care

## 2017-03-08 ENCOUNTER — Ambulatory Visit: Payer: Medicare Other

## 2017-03-24 DIAGNOSIS — Z79899 Other long term (current) drug therapy: Secondary | ICD-10-CM | POA: Diagnosis not present

## 2017-03-24 DIAGNOSIS — F319 Bipolar disorder, unspecified: Secondary | ICD-10-CM | POA: Diagnosis not present

## 2017-06-24 DIAGNOSIS — F319 Bipolar disorder, unspecified: Secondary | ICD-10-CM | POA: Diagnosis not present

## 2017-06-24 DIAGNOSIS — F3160 Bipolar disorder, current episode mixed, unspecified: Secondary | ICD-10-CM | POA: Diagnosis not present

## 2017-06-24 DIAGNOSIS — Z79899 Other long term (current) drug therapy: Secondary | ICD-10-CM | POA: Diagnosis not present

## 2017-10-05 DIAGNOSIS — F3160 Bipolar disorder, current episode mixed, unspecified: Secondary | ICD-10-CM | POA: Diagnosis not present

## 2017-10-05 DIAGNOSIS — F319 Bipolar disorder, unspecified: Secondary | ICD-10-CM | POA: Diagnosis not present

## 2017-10-05 DIAGNOSIS — Z79899 Other long term (current) drug therapy: Secondary | ICD-10-CM | POA: Diagnosis not present

## 2017-10-13 ENCOUNTER — Encounter: Payer: Self-pay | Admitting: *Deleted

## 2017-10-13 DIAGNOSIS — J029 Acute pharyngitis, unspecified: Secondary | ICD-10-CM | POA: Diagnosis not present

## 2017-10-13 DIAGNOSIS — H6692 Otitis media, unspecified, left ear: Secondary | ICD-10-CM | POA: Diagnosis not present

## 2017-10-13 DIAGNOSIS — H6123 Impacted cerumen, bilateral: Secondary | ICD-10-CM | POA: Diagnosis not present

## 2017-10-14 ENCOUNTER — Ambulatory Visit: Payer: Medicare Other | Admitting: Family Medicine

## 2017-12-02 ENCOUNTER — Encounter (INDEPENDENT_AMBULATORY_CARE_PROVIDER_SITE_OTHER): Payer: Self-pay

## 2018-01-20 DIAGNOSIS — F3160 Bipolar disorder, current episode mixed, unspecified: Secondary | ICD-10-CM | POA: Diagnosis not present

## 2018-01-20 DIAGNOSIS — F319 Bipolar disorder, unspecified: Secondary | ICD-10-CM | POA: Diagnosis not present

## 2018-01-20 DIAGNOSIS — Z79899 Other long term (current) drug therapy: Secondary | ICD-10-CM | POA: Diagnosis not present

## 2018-02-07 ENCOUNTER — Ambulatory Visit: Payer: Self-pay

## 2018-02-09 ENCOUNTER — Encounter: Payer: Self-pay | Admitting: Primary Care

## 2018-03-07 ENCOUNTER — Telehealth: Payer: Self-pay

## 2018-03-07 NOTE — Telephone Encounter (Signed)
Called and spoken to patient. Notified patient that did not see any thing in the chart that shows that someone has called patient. Also patient is no longer coming to this office. [facility].

## 2018-03-07 NOTE — Telephone Encounter (Signed)
Copied from CRM [phone]#[id number]. Topic: General - Call Back - No Documentation >> Mar 07, 2018  9:32 AM Jay SchlichterWeikart, Melissa J wrote: Reason for CRM: [phone] Pt is returning call, no crm

## 2018-03-08 ENCOUNTER — Ambulatory Visit: Payer: Medicare Other

## 2018-03-11 ENCOUNTER — Encounter: Payer: Self-pay | Admitting: Primary Care

## 2018-04-04 ENCOUNTER — Ambulatory Visit (INDEPENDENT_AMBULATORY_CARE_PROVIDER_SITE_OTHER): Payer: Medicare Other | Admitting: Family Medicine

## 2018-04-04 ENCOUNTER — Encounter: Payer: Self-pay | Admitting: Family Medicine

## 2018-04-04 VITALS — BP 124/66 | HR 67 | Temp 98.1°F | Ht 72.0 in | Wt 239.6 lb

## 2018-04-04 DIAGNOSIS — H538 Other visual disturbances: Secondary | ICD-10-CM

## 2018-04-04 DIAGNOSIS — F3177 Bipolar disorder, in partial remission, most recent episode mixed: Secondary | ICD-10-CM | POA: Diagnosis not present

## 2018-04-04 DIAGNOSIS — H029 Unspecified disorder of eyelid: Secondary | ICD-10-CM | POA: Diagnosis not present

## 2018-04-04 DIAGNOSIS — F419 Anxiety disorder, unspecified: Secondary | ICD-10-CM | POA: Diagnosis not present

## 2018-04-04 DIAGNOSIS — Z79899 Other long term (current) drug therapy: Secondary | ICD-10-CM

## 2018-04-04 DIAGNOSIS — K219 Gastro-esophageal reflux disease without esophagitis: Secondary | ICD-10-CM

## 2018-04-04 NOTE — Progress Notes (Signed)
Patient: Frederick [name], Male    DOB: [birth year], 42 y.o.   MRN: [id number] Visit Date: 04/04/2018  Today's Provider: Shirlee Latch, MD   Chief Complaint  Patient presents with  . Establish Care   Subjective:  I, Frederick [name], CMA, am acting as a scribe for Shirlee Latch, MD.   Frederick [name] is a 42 year old male who presents today to Establish Care as a new patient. He was previously at patient at [facility]. He reports feeling fairly well. He states he play disc golf for exercise. He reports sleeping poorly.   GERD: States that he is asymptomatic and well controlled.  He is taking pantoprazole daily with good compliance.  Arthritis: Patient states he has arthritis in bilateral shoulders and possibly in his feet.  He hurt his foot while playing disc golf last year and now will swell intermittently.  He finds if he wears compression socks this helps significantly.   Bipolar disorder, anxiety: Seeing psych Howell Rucks at [facility] in [place]).  He denies any recent manic episodes or panic attacks.  He is taking Klonopin, lithium, Trileptal, and Topamax.  He states that his levels are monitored by psychiatry regularly.  He has follow-up appointments with them at least every 3 months.  Bump on L eyelid: He first noticed this several months ago.  It seems like it has been more inflamed for the past few weeks.  It does not affect his vision or eye movements.  There is been no erythema.  He does state that his vision is blurry in general and he needs to be seen by an eye doctor to know if he needs glasses.  Does not currently wear any glasses. -----------------------------------------------------------------   Review of Systems  Constitutional: Positive for appetite change and unexpected weight change.  HENT: Negative.   Eyes: Negative.   Respiratory: Negative.   Cardiovascular: Negative.   Gastrointestinal: Positive for constipation and  vomiting.  Endocrine: Negative.   Genitourinary: Negative.   Musculoskeletal: Positive for arthralgias, back pain and myalgias.  Allergic/Immunologic: Positive for food allergies.  Neurological: Positive for tremors and headaches.  Hematological: Negative.   Psychiatric/Behavioral: Positive for decreased concentration and sleep disturbance. The patient is nervous/anxious and is hyperactive.     Social History      He  reports that he has quit smoking. His smoking use included cigarettes. He smoked 0.00 packs per day. He has never used smokeless tobacco. He reports that he drinks alcohol. He reports that he does not use drugs.       Social History   Socioeconomic History  . Marital status: Married    Spouse name: Not on file  . Number of children: Not on file  . Years of education: Not on file  . Highest education level: Not on file  Occupational History  . Not on file  Social Needs  . Financial resource strain: Not on file  . Food insecurity:    Worry: Not on file    Inability: Not on file  . Transportation needs:    Medical: Not on file    Non-medical: Not on file  Tobacco Use  . Smoking status: Former Smoker    Packs/day: 0.00    Types: Cigarettes  . Smokeless tobacco: Never Used  . Tobacco comment: quit 5 weeks ago  Substance and Sexual Activity  . Alcohol use: Yes    Alcohol/week: 0.0 standard drinks  . Drug use: No  .  Sexual activity: Not on file  Lifestyle  . Physical activity:    Days per week: Not on file    Minutes per session: Not on file  . Stress: Not on file  Relationships  . Social connections:    Talks on phone: Not on file    Gets together: Not on file    Attends religious service: Not on file    Active member of club or organization: Not on file    Attends meetings of clubs or organizations: Not on file    Relationship status: Not on file  Other Topics Concern  . Not on file  Social History Narrative   Married.   2 children.   Disabled.    Enjoys playing disc golf.     Past Medical History:  Diagnosis Date  . Allergy   . Anxiety   . Arthritis   . Bipolar disorder (HCC)   . GERD (gastroesophageal reflux disease)      Patient Active Problem List   Diagnosis Date Noted  . GERD (gastroesophageal reflux disease) 09/30/2016  . Right upper quadrant abdominal pain 09/30/2016  . Medicare annual wellness visit, subsequent 01/28/2016  . Back pain 10/28/2015  . Bipolar disorder, unspecified (HCC) 01/04/2012    History reviewed. No pertinent surgical history.  Family History        Family Status  Relation Name Status  . Mother  Alive  . Father  Alive  . Brother  Alive  . Mat Uncle  (Not Specified)  . Oneal GroutPat Uncle  (Not Specified)  . MGM  Deceased  . MGF  Deceased  . PGM  Deceased  . PGF  Deceased        His family history includes Arthritis in his father; Bipolar disorder in his brother, maternal uncle, mother, and paternal uncle; Cancer in his paternal grandmother.      Allergies  Allergen Reactions  . Alprazolam Itching and Nausea And Vomiting  . Bee Venom   . Clindamycin Hives  . Ziprasidone Hcl Itching     Current Outpatient Medications:  .  lithium carbonate (ESKALITH) 450 MG CR tablet, Take one tablet by mouth in the morning and two tablets by mouth every evening, Disp: 270 tablet, Rfl: 1 .  Oxcarbazepine (TRILEPTAL) 300 MG tablet, Take one tablet each morning, and two at bedtime., Disp: 270 tablet, Rfl: 0 .  pantoprazole (PROTONIX) 40 MG tablet, Take 40 mg by mouth daily., Disp: , Rfl:  .  topiramate (TOPAMAX) 50 MG tablet, Take 50 mg by mouth 2 (two) times daily., Disp: , Rfl:  .  clonazePAM (KLONOPIN) 2 MG tablet, Take 1 tablet (2 mg total) by mouth 4 (four) times daily as needed for anxiety., Disp: 120 tablet, Rfl: 1   Patient Care Team: Erasmo DownerBacigalupo, Chantil Bari M, MD as PCP - General (Family Medicine)      Objective:   Vitals: BP 124/66 (BP Location: Right Arm, Patient Position: Sitting, Cuff Size:  Large)   Pulse 67   Temp 98.1 F (36.7 C) (Oral)   Ht 6' (1.829 m)   Wt 239 lb 9.6 oz (108.7 kg)   SpO2 98%   BMI 32.50 kg/m    Vitals:   04/04/18 1411  BP: 124/66  Pulse: 67  Temp: 98.1 F (36.7 C)  TempSrc: Oral  SpO2: 98%  Weight: 239 lb 9.6 oz (108.7 kg)  Height: 6' (1.829 m)     Physical Exam  Constitutional: He is oriented to person, place, and time. He  appears well-developed and well-nourished. No distress.  HENT:  Head: Normocephalic and atraumatic.  Right Ear: External ear normal.  Left Ear: External ear normal.  Nose: Nose normal.  Mouth/Throat: Oropharynx is clear and moist.  Eyes: Pupils are equal, round, and reactive to light. Conjunctivae and EOM are normal. No scleral icterus.  Small papule on left upper eyelid just above nasolacrimal duct  Neck: Neck supple. No thyromegaly present.  Cardiovascular: Normal rate, regular rhythm, normal heart sounds and intact distal pulses.  No murmur heard. Pulmonary/Chest: Effort normal and breath sounds normal. No respiratory distress. He has no wheezes. He has no rales.  Abdominal: Soft. Bowel sounds are normal. He exhibits no distension. There is no tenderness. There is no rebound and no guarding.  Musculoskeletal: He exhibits no edema or deformity.  Lymphadenopathy:    He has no cervical adenopathy.  Neurological: He is alert and oriented to person, place, and time.  Skin: Skin is warm and dry. Capillary refill takes less than 2 seconds. No rash noted.  Psychiatric: His behavior is normal. His mood appears not anxious. His affect is blunt. His affect is not labile. His speech is not rapid and/or pressured and not tangential. He is not actively hallucinating. He does not exhibit a depressed mood. He expresses no homicidal and no suicidal ideation. He expresses no suicidal plans and no homicidal plans. He is attentive.  Vitals reviewed.    Depression Screen PHQ 2/9 Scores 04/04/2018 02/05/2017 01/28/2016  PHQ - 2 Score  1 1 0  PHQ- 9 Score 6 - -     Assessment & Plan:    Problem List Items Addressed This Visit      Digestive   GERD (gastroesophageal reflux disease)    Well-controlled and asymptomatic Continue pantoprazole daily Discussed importance of diet and exercise as well      Relevant Medications   pantoprazole (PROTONIX) 40 MG tablet     Other   Bipolar disorder, unspecified (HCC) - Primary    Currently well controlled Followed by psychiatry No changes to medications from me No SI/HI, evidence of AVH, pressured speech, or manic symptoms      Anxiety    Seems to be well-controlled today Followed by psychiatry Continue current medications      Long-term use of high-risk medication    As patient is taking lithium, we will monitor for any signs of metabolic disorder and thyroid function      Relevant Orders   Lipid panel (Completed)   Comprehensive metabolic panel (Completed)   Hemoglobin A1c (Completed)   TSH    Other Visit Diagnoses    Eyelid abnormality       Relevant Orders   Ambulatory referral to Ophthalmology   Blurry vision       Relevant Orders   Ambulatory referral to Ophthalmology       Return in about 1 year (around 04/05/2019) for chronic disease f/u. Also needs AWV scheduled in next month or so Reviewed HCM with patient    The entirety of the information documented in the History of Present Illness, Review of Systems and Physical Exam were personally obtained by me. Portions of this information were initially documented by Frederick RaddleNikki Walston, CMA and reviewed by me for thoroughness and accuracy.    Erasmo DownerBacigalupo, Alyha Marines M, MD, MPH [facility] 04/05/2018 8:14 AM

## 2018-04-04 NOTE — Patient Instructions (Signed)
Preventive Care 40-64 Years, Male Preventive care refers to lifestyle choices and visits with your health care provider that can promote health and wellness. What does preventive care include?  A yearly physical exam. This is also called an annual well check.  Dental exams once or twice a year.  Routine eye exams. Ask your health care provider how often you should have your eyes checked.  Personal lifestyle choices, including: ? Daily care of your teeth and gums. ? Regular physical activity. ? Eating a healthy diet. ? Avoiding tobacco and drug use. ? Limiting alcohol use. ? Practicing safe sex. ? Taking low-dose aspirin every day starting at age 45. What happens during an annual well check? The services and screenings done by your health care provider during your annual well check will depend on your age, overall health, lifestyle risk factors, and family history of disease. Counseling Your health care provider may ask you questions about your:  Alcohol use.  Tobacco use.  Drug use.  Emotional well-being.  Home and relationship well-being.  Sexual activity.  Eating habits.  Work and work Statistician.  Screening You may have the following tests or measurements:  Height, weight, and BMI.  Blood pressure.  Lipid and cholesterol levels. These may be checked every 5 years, or more frequently if you are over 61 years old.  Skin check.  Lung cancer screening. You may have this screening every year starting at age 44 if you have a 30-pack-year history of smoking and currently smoke or have quit within the past 15 years.  Fecal occult blood test (FOBT) of the stool. You may have this test every year starting at age 44.  Flexible sigmoidoscopy or colonoscopy. You may have a sigmoidoscopy every 5 years or a colonoscopy every 10 years starting at age 44.  Prostate cancer screening. Recommendations will vary depending on your family history and other risks.  Hepatitis C  blood test.  Hepatitis B blood test.  Sexually transmitted disease (STD) testing.  Diabetes screening. This is done by checking your blood sugar (glucose) after you have not eaten for a while (fasting). You may have this done every 1-3 years.  Discuss your test results, treatment options, and if necessary, the need for more tests with your health care provider. Vaccines Your health care provider may recommend certain vaccines, such as:  Influenza vaccine. This is recommended every year.  Tetanus, diphtheria, and acellular pertussis (Tdap, Td) vaccine. You may need a Td booster every 10 years.  Varicella vaccine. You may need this if you have not been vaccinated.  Zoster vaccine. You may need this after age 44.  Measles, mumps, and rubella (MMR) vaccine. You may need at least one dose of MMR if you were born in 1957 or later. You may also need a second dose.  Pneumococcal 13-valent conjugate (PCV13) vaccine. You may need this if you have certain conditions and have not been vaccinated.  Pneumococcal polysaccharide (PPSV23) vaccine. You may need one or two doses if you smoke cigarettes or if you have certain conditions.  Meningococcal vaccine. You may need this if you have certain conditions.  Hepatitis A vaccine. You may need this if you have certain conditions or if you travel or work in places where you may be exposed to hepatitis A.  Hepatitis B vaccine. You may need this if you have certain conditions or if you travel or work in places where you may be exposed to hepatitis B.  Haemophilus influenzae type b (Hib) vaccine.  You may need this if you have certain risk factors.  Talk to your health care provider about which screenings and vaccines you need and how often you need them. This information is not intended to replace advice given to you by your health care provider. Make sure you discuss any questions you have with your health care provider. Document Released: 08/30/2015  Document Revised: 04/22/2016 Document Reviewed: 06/04/2015 Elsevier Interactive Patient Education  [employer].

## 2018-04-05 DIAGNOSIS — Z79899 Other long term (current) drug therapy: Secondary | ICD-10-CM | POA: Insufficient documentation

## 2018-04-05 LAB — COMPREHENSIVE METABOLIC PANEL
ALBUMIN: 4.6 g/dL (ref 3.5–5.5)
ALK PHOS: 117 IU/L (ref 39–117)
ALT: 39 IU/L (ref 0–44)
AST: 22 IU/L (ref 0–40)
Albumin/Globulin Ratio: 2 (ref 1.2–2.2)
BILIRUBIN TOTAL: 0.3 mg/dL (ref 0.0–1.2)
BUN / CREAT RATIO: 12 (ref 9–20)
BUN: 14 mg/dL (ref 6–24)
CHLORIDE: 108 mmol/L — AB (ref 96–106)
CO2: 19 mmol/L — ABNORMAL LOW (ref 20–29)
Calcium: 9.4 mg/dL (ref 8.7–10.2)
Creatinine, Ser: 1.2 mg/dL (ref 0.76–1.27)
GFR calc Af Amer: 85 mL/min/{1.73_m2} (ref >59)
GFR calc non Af Amer: 73 mL/min/{1.73_m2} (ref >59)
GLOBULIN, TOTAL: 2.3 g/dL (ref 1.5–4.5)
Glucose: 100 mg/dL — ABNORMAL HIGH (ref 65–99)
Potassium: 4.3 mmol/L (ref 3.5–5.2)
SODIUM: 142 mmol/L (ref 134–144)
Total Protein: 6.9 g/dL (ref 6.0–8.5)

## 2018-04-05 LAB — LIPID PANEL
CHOLESTEROL TOTAL: 197 mg/dL (ref 100–199)
Chol/HDL Ratio: 5.1 ratio — ABNORMAL HIGH (ref 0.0–5.0)
HDL: 39 mg/dL — AB (ref >39)
LDL Calculated: 133 mg/dL — ABNORMAL HIGH (ref 0–99)
Triglycerides: 125 mg/dL (ref 0–149)
VLDL CHOLESTEROL CAL: 25 mg/dL (ref 5–40)

## 2018-04-05 LAB — TSH: TSH: 2.36 u[IU]/mL (ref 0.450–4.500)

## 2018-04-05 LAB — HEMOGLOBIN A1C
Est. average glucose Bld gHb Est-mCnc: 111 mg/dL
HEMOGLOBIN A1C: 5.5 % (ref 4.8–5.6)

## 2018-04-05 NOTE — Assessment & Plan Note (Signed)
Well-controlled and asymptomatic Continue pantoprazole daily Discussed importance of diet and exercise as well

## 2018-04-05 NOTE — Assessment & Plan Note (Signed)
As patient is taking lithium, we will monitor for any signs of metabolic disorder and thyroid function

## 2018-04-05 NOTE — Assessment & Plan Note (Signed)
Seems to be well-controlled today Followed by psychiatry Continue current medications

## 2018-04-05 NOTE — Assessment & Plan Note (Signed)
Currently well controlled Followed by psychiatry No changes to medications from me No SI/HI, evidence of AVH, pressured speech, or manic symptoms

## 2018-04-06 ENCOUNTER — Telehealth: Payer: Self-pay

## 2018-04-06 NOTE — Telephone Encounter (Signed)
Pt returned call ° °teri °

## 2018-04-06 NOTE — Telephone Encounter (Signed)
-----   Message from Erasmo DownerAngela M Bacigalupo, MD sent at 04/06/2018 11:27 AM EDT ----- Cholesterol is high, but 10 year risk of heart disease/stroke is low at 2.3%.  This risk will decrease more the longer that you stay away from smoking.  Normal kidney function, liver function, electrolytes, hemoglobin A1c, and thyroid function.  Erasmo DownerBacigalupo, Angela M, MD, MPH [facility] 04/06/2018 11:27 AM

## 2018-04-06 NOTE — Telephone Encounter (Signed)
Tried calling pt. Went straight to VM, which has not been set up yet.

## 2018-04-07 NOTE — Telephone Encounter (Signed)
Tried calling pt. Was not able to leave VM.

## 2018-04-11 NOTE — Telephone Encounter (Signed)
Patient advised as directed below. 

## 2018-05-06 ENCOUNTER — Ambulatory Visit: Payer: Medicare Other

## 2018-05-09 ENCOUNTER — Telehealth: Payer: Self-pay

## 2018-05-09 NOTE — Telephone Encounter (Signed)
Pt called to cancel his 05/12/18 appointment and would like to reschedule. Please call pt @ [phone]. Thanks CC

## 2018-05-09 NOTE — Telephone Encounter (Signed)
Reschedule pts AWV for 05/26/18 @ 11 AM. -MM

## 2018-05-12 ENCOUNTER — Ambulatory Visit: Payer: Medicare Other

## 2018-05-26 ENCOUNTER — Other Ambulatory Visit: Payer: Self-pay | Admitting: Family Medicine

## 2018-05-26 ENCOUNTER — Ambulatory Visit (INDEPENDENT_AMBULATORY_CARE_PROVIDER_SITE_OTHER): Payer: Medicare Other

## 2018-05-26 VITALS — BP 116/82 | HR 69 | Temp 98.3°F | Ht 72.0 in | Wt 247.8 lb

## 2018-05-26 DIAGNOSIS — Z23 Encounter for immunization: Secondary | ICD-10-CM

## 2018-05-26 DIAGNOSIS — Z0000 Encounter for general adult medical examination without abnormal findings: Secondary | ICD-10-CM

## 2018-05-26 MED ORDER — PANTOPRAZOLE SODIUM 40 MG PO TBEC: 40.0000 mg | DELAYED_RELEASE_TABLET | Freq: Every day | ORAL | 11 refills | Status: DC

## 2018-05-26 NOTE — Progress Notes (Signed)
Subjective:   Frederick [name] is a 42 y.o. male who presents for Medicare Annual/Subsequent preventive examination.  Review of Systems:  N/A  Cardiac Risk Factors include: smoking/ tobacco exposure;male gender;obesity (BMI >30kg/m2)     Objective:    Vitals: BP 116/82 (BP Location: Right Arm)   Pulse 69   Temp 98.3 F (36.8 C) (Oral)   Ht 6' (1.829 m)   Wt 247 lb 12.8 oz (112.4 kg)   BMI 33.61 kg/m   Body mass index is 33.61 kg/m.  Advanced Directives 05/26/2018 02/05/2017  Does Patient Have a Medical Advance Directive? Yes Yes  Type of Estate agent of [place];Living will Healthcare Power of [place];Living will  Copy of Healthcare Power of Attorney in Chart? No - copy requested Yes    Tobacco Social History   Tobacco Use  Smoking Status Current Every Day Smoker  . Packs/day: 0.25  . Years: 10.00  . Pack years: 2.50  . Types: Cigarettes  Smokeless Tobacco Never Used  Tobacco Comment   smokes about 2-3 a day     Ready to quit: No Counseling given: No Comment: smokes about 2-3 a day   Clinical Intake:  Pre-visit preparation completed: Yes  Pain : No/denies pain Pain Score: 0-No pain     Nutritional Status: BMI > 30  Obese Nutritional Risks: None Diabetes: No  How often do you need to have someone help you when you read instructions, pamphlets, or other written materials from your doctor or pharmacy?: 1 - Never  Interpreter Needed?: No  Information entered by :: [facility], LPN  Past Medical History:  Diagnosis Date  . Allergy   . Anxiety   . Arthritis   . Bipolar disorder (HCC)   . GERD (gastroesophageal reflux disease)    Past Surgical History:  Procedure Laterality Date  . PILONIDAL CYST EXCISION     Family History  Problem Relation Age of Onset  . Bipolar disorder Mother   . Arthritis Father   . Bipolar disorder Brother   . Bipolar disorder Maternal Uncle   . Bipolar disorder Paternal Uncle   . Cancer  Paternal Grandmother        lymphoma?   Social History   Socioeconomic History  . Marital status: Married    Spouse name: Not on file  . Number of children: 2  . Years of education: Not on file  . Highest education level: Associate degree: occupational, Scientist, product/process development, or vocational program  Occupational History  . Occupation: disability  Social Needs  . Financial resource strain: Not hard at all  . Food insecurity:    Worry: Never true    Inability: Never true  . Transportation needs:    Medical: No    Non-medical: No  Tobacco Use  . Smoking status: Current Every Day Smoker    Packs/day: 0.25    Years: 10.00    Pack years: 2.50    Types: Cigarettes  . Smokeless tobacco: Never Used  . Tobacco comment: smokes about 2-3 a day  Substance and Sexual Activity  . Alcohol use: Yes    Alcohol/week: 1.0 - 2.0 standard drinks    Types: 1 - 2 Cans of beer per week  . Drug use: No  . Sexual activity: Yes    Partners: Female  Lifestyle  . Physical activity:    Days per week: Not on file    Minutes per session: Not on file  . Stress: Very much  Relationships  . Social connections:  Talks on phone: Patient refused    Gets together: Patient refused    Attends religious service: Patient refused    Active member of club or organization: Patient refused    Attends meetings of clubs or organizations: Patient refused    Relationship status: Patient refused  Other Topics Concern  . Not on file  Social History Narrative   Married.   2 children.   Disabled.   Enjoys playing disc golf.     Outpatient Encounter Medications as of 05/26/2018  Medication Sig  . clonazePAM (KLONOPIN) 2 MG tablet Take 1 tablet (2 mg total) by mouth 4 (four) times daily as needed for anxiety.  Marland Kitchen lithium carbonate (ESKALITH) 450 MG CR tablet Take one tablet by mouth in the morning and two tablets by mouth every evening  . Oxcarbazepine (TRILEPTAL) 300 MG tablet Take one tablet each morning, and two at  bedtime.  . pantoprazole (PROTONIX) 40 MG tablet Take 40 mg by mouth daily.  Marland Kitchen topiramate (TOPAMAX) 50 MG tablet Take 50 mg by mouth 2 (two) times daily.   No facility-administered encounter medications on file as of 05/26/2018.     Activities of Daily Living In your present state of health, do you have any difficulty performing the following activities: 05/26/2018 04/04/2018  Hearing? N N  Vision? Y Y  Comment Awaiting to be called from Eye dr fo first apt. Referral sent 08/19. Advised pt to contact office to check status.  -  Difficulty concentrating or making decisions? Malvin Johns  Walking or climbing stairs? N N  Dressing or bathing? N N  Doing errands, shopping? N N  Preparing Food and eating ? N -  Using the Toilet? N -  In the past six months, have you accidently leaked urine? N -  Do you have problems with loss of bowel control? N -  Managing your Medications? N -  Managing your Finances? Y -  Comment Wife manages finances.  -  Housekeeping or managing your Housekeeping? N -  Some recent data might be hidden    Patient Care Team: Bacigalupo, Marzella Schlein, MD as PCP - General (Family Medicine) Lynett Fish, MD as Referring Physician (Psychiatry)   Assessment:   This is a routine wellness examination for [name].  Exercise Activities and Dietary recommendations Current Exercise Habits: Home exercise routine, Type of exercise: walking;Other - see comments(disc golf), Time (Minutes): > 60, Frequency (Times/Week): 5, Weekly Exercise (Minutes/Week): 0, Intensity: Mild, Exercise limited by: None identified  Goals    . Have 3 meals a day     Recommend to decrease portion sizes by eating 3 small healthy meals and at least 2 healthy snacks per day.         . weight management     Target weight is 200lbs. Starting 02/05/17, I will continue to play disc golf for at least 2 hours or more as schedule permits.        Fall Risk Fall Risk  05/26/2018 04/04/2018 02/05/2017 01/28/2016  Falls  in the past year? Yes No Yes No  Comment - - pt fell after tripping over dog -  Number falls in past yr: 2 or more - 1 -  Comment playing disc golf - - -  Injury with Fall? No - Yes -   FALL RISK PREVENTION PERTAINING TO THE HOME:  Any stairs in or around the home WITH handrails? Yes  Home free of loose throw rugs in walkways, pet beds, electrical cords, etc? No  Adequate lighting in your home to reduce risk of falls? No   ASSISTIVE DEVICES UTILIZED TO PREVENT FALLS:  Life alert? No  Use of a cane, walker or w/c? No  Grab bars in the bathroom? No  Shower chair or bench in shower? No  Elevated toilet seat or a handicapped toilet? No   DME ORDERS:  DME order needed?  No   TIMED UP AND GO:  Was the test performed? No .   Depression Screen PHQ 2/9 Scores 05/26/2018 04/04/2018 02/05/2017 01/28/2016  PHQ - 2 Score 1 1 1  0  PHQ- 9 Score 9 6 - -    Cognitive Function MMSE - Mini Mental State Exam 02/05/2017  Orientation to time 5  Orientation to Place 5  Registration 3  Attention/ Calculation 0  Recall 3  Language- name 2 objects 0  Language- repeat 1  Language- follow 3 step command 3  Language- read & follow direction 0  Write a sentence 0  Copy design 0  Total score 20     6CIT Screen 05/26/2018  What Year? 0 points  What month? 0 points  What time? 0 points  Count back from 20 0 points  Months in reverse 0 points  Repeat phrase 0 points  Total Score 0    Immunization History  Administered Date(s) Administered  . Influenza,inj,Quad PF,6+ Mos 04/29/2016, 05/26/2018  . Influenza-Unspecified 04/29/2016    Qualifies for Shingles Vaccine? N/A  Tdap: Although this vaccine is not a covered service during a Wellness Exam, does the patient still wish to receive this vaccine today?  No .  Education has been provided regarding the importance of this vaccine. Advised may receive this vaccine at local pharmacy or Health Dept. Aware to provide a copy of the vaccination  record if obtained from local pharmacy or Health Dept. Verbalized acceptance and understanding.  Flu Vaccine: Due for Flu vaccine. Does the patient want to receive this vaccine today?  Yes .   Pneumococcal Vaccine: N/A   Screening Tests Health Maintenance  Topic Date Due  . TETANUS/TDAP  02/06/2027 (Originally 01/23/1993)  . HIV Screening  02/05/2099 (Originally 01/23/1989)  . INFLUENZA VACCINE  Completed   Cancer Screenings:  Colorectal Screening: N/A  Lung Cancer Screening: (Low Dose CT Chest recommended if Age 10-80 years, 30 pack-year currently smoking OR have quit w/in 15years.) does not qualify.   Additional Screening:  Hepatitis C Screening: N/A  Vision Screening: Recommended annual ophthalmology exams for early detection of glaucoma and other disorders of the eye.  Dental Screening: Recommended annual dental exams for proper oral hygiene  Community Resource Referral:  CRR required this visit?  No        Plan:  I have personally reviewed and addressed the Medicare Annual Wellness questionnaire and have noted the following in the patient's chart:  A. Medical and social history B. Use of alcohol, tobacco or illicit drugs  C. Current medications and supplements D. Functional ability and status E.  Nutritional status F.  Physical activity G. Advance directives H. List of other physicians I.  Hospitalizations, surgeries, and ER visits in previous 12 months J.  Vitals K. Screenings such as hearing and vision if needed, cognitive and depression L. Referrals and appointments - none  In addition, I have reviewed and discussed with patient certain preventive protocols, quality metrics, and best practice recommendations. A written personalized care plan for preventive services as well as general preventive health recommendations were provided to patient.  See attached scanned  questionnaire for additional information.   Signed,  Hyacinth Meeker, LPN Nurse Health  Advisor   Nurse Recommendations: Pt declined the tetanus vaccine and HIV lab today.

## 2018-05-26 NOTE — Patient Instructions (Addendum)
Frederick [name] , Thank you for taking time to come for your Medicare Wellness Visit. I appreciate your ongoing commitment to your health goals. Please review the following plan we discussed and let me know if I can assist you in the future.   Screening recommendations/referrals: Colonoscopy: N/A Recommended yearly ophthalmology/optometry visit for glaucoma screening and checkup Recommended yearly dental visit for hygiene and checkup  Vaccinations: Influenza vaccine: Up to date Pneumococcal vaccine: N/A Tdap vaccine: Pt declines today.  Shingles vaccine: N/A    Advanced directives: Please bring a copy of your POA (Power of Attorney) and/or Living Will to your next appointment.   Conditions/risks identified: Smoking cessation; Obesity- recommend to eat 3 meals a day with 2 healthy snacks in between.   Next appointment: 08/19/18   Preventive Care 40-64 Years, Male Preventive care refers to lifestyle choices and visits with your health care provider that can promote health and wellness. What does preventive care include?  A yearly physical exam. This is also called an annual well check.  Dental exams once or twice a year.  Routine eye exams. Ask your health care provider how often you should have your eyes checked.  Personal lifestyle choices, including:  Daily care of your teeth and gums.  Regular physical activity.  Eating a healthy diet.  Avoiding tobacco and drug use.  Limiting alcohol use.  Practicing safe sex.  Taking low-dose aspirin every day starting at age 65. What happens during an annual well check? The services and screenings done by your health care provider during your annual well check will depend on your age, overall health, lifestyle risk factors, and family history of disease. Counseling  Your health care provider may ask you questions about your:  Alcohol use.  Tobacco use.  Drug use.  Emotional well-being.  Home and relationship  well-being.  Sexual activity.  Eating habits.  Work and work Astronomer. Screening  You may have the following tests or measurements:  Height, weight, and BMI.  Blood pressure.  Lipid and cholesterol levels. These may be checked every 5 years, or more frequently if you are over 30 years old.  Skin check.  Lung cancer screening. You may have this screening every year starting at age 76 if you have a 30-pack-year history of smoking and currently smoke or have quit within the past 15 years.  Fecal occult blood test (FOBT) of the stool. You may have this test every year starting at age 52.  Flexible sigmoidoscopy or colonoscopy. You may have a sigmoidoscopy every 5 years or a colonoscopy every 10 years starting at age 41.  Prostate cancer screening. Recommendations will vary depending on your family history and other risks.  Hepatitis C blood test.  Hepatitis B blood test.  Sexually transmitted disease (STD) testing.  Diabetes screening. This is done by checking your blood sugar (glucose) after you have not eaten for a while (fasting). You may have this done every 1-3 years. Discuss your test results, treatment options, and if necessary, the need for more tests with your health care provider. Vaccines  Your health care provider may recommend certain vaccines, such as:  Influenza vaccine. This is recommended every year.  Tetanus, diphtheria, and acellular pertussis (Tdap, Td) vaccine. You may need a Td booster every 10 years.  Zoster vaccine. You may need this after age 35.  Pneumococcal 13-valent conjugate (PCV13) vaccine. You may need this if you have certain conditions and have not been vaccinated.  Pneumococcal polysaccharide (PPSV23) vaccine. You may need  one or two doses if you smoke cigarettes or if you have certain conditions. Talk to your health care provider about which screenings and vaccines you need and how often you need them. This information is not intended  to replace advice given to you by your health care provider. Make sure you discuss any questions you have with your health care provider. Document Released: 08/30/2015 Document Revised: 04/22/2016 Document Reviewed: 06/04/2015 Elsevier Interactive Patient Education  2017 [place] Prevention in the Home Falls can cause injuries. They can happen to people of all ages. There are many things you can do to make your home safe and to help prevent falls. What can I do on the outside of my home?  Regularly fix the edges of walkways and driveways and fix any cracks.  Remove anything that might make you trip as you walk through a door, such as a raised step or threshold.  Trim any bushes or trees on the path to your home.  Use bright outdoor lighting.  Clear any walking paths of anything that might make someone trip, such as rocks or tools.  Regularly check to see if handrails are loose or broken. Make sure that both sides of any steps have handrails.  Any raised decks and porches should have guardrails on the edges.  Have any leaves, snow, or ice cleared regularly.  Use sand or salt on walking paths during winter.  Clean up any spills in your garage right away. This includes oil or grease spills. What can I do in the bathroom?  Use night lights.  Install grab bars by the toilet and in the tub and shower. Do not use towel bars as grab bars.  Use non-skid mats or decals in the tub or shower.  If you need to sit down in the shower, use a plastic, non-slip stool.  Keep the floor dry. Clean up any water that spills on the floor as soon as it happens.  Remove soap buildup in the tub or shower regularly.  Attach bath mats securely with double-sided non-slip rug tape.  Do not have throw rugs and other things on the floor that can make you trip. What can I do in the bedroom?  Use night lights.  Make sure that you have a light by your bed that is easy to reach.  Do not use  any sheets or blankets that are too big for your bed. They should not hang down onto the floor.  Have a firm chair that has side arms. You can use this for support while you get dressed.  Do not have throw rugs and other things on the floor that can make you trip. What can I do in the kitchen?  Clean up any spills right away.  Avoid walking on wet floors.  Keep items that you use a lot in easy-to-reach places.  If you need to reach something above you, use a strong step stool that has a grab bar.  Keep electrical cords out of the way.  Do not use floor polish or wax that makes floors slippery. If you must use wax, use non-skid floor wax.  Do not have throw rugs and other things on the floor that can make you trip. What can I do with my stairs?  Do not leave any items on the stairs.  Make sure that there are handrails on both sides of the stairs and use them. Fix handrails that are broken or loose. Make sure that  handrails are as long as the stairways.  Check any carpeting to make sure that it is firmly attached to the stairs. Fix any carpet that is loose or worn.  Avoid having throw rugs at the top or bottom of the stairs. If you do have throw rugs, attach them to the floor with carpet tape.  Make sure that you have a light switch at the top of the stairs and the bottom of the stairs. If you do not have them, ask someone to add them for you. What else can I do to help prevent falls?  Wear shoes that:  Do not have high heels.  Have rubber bottoms.  Are comfortable and fit you well.  Are closed at the toe. Do not wear sandals.  If you use a stepladder:  Make sure that it is fully opened. Do not climb a closed stepladder.  Make sure that both sides of the stepladder are locked into place.  Ask someone to hold it for you, if possible.  Clearly mark and make sure that you can see:  Any grab bars or handrails.  First and last steps.  Where the edge of each step  is.  Use tools that help you move around (mobility aids) if they are needed. These include:  Canes.  Walkers.  Scooters.  Crutches.  Turn on the lights when you go into a dark area. Replace any light bulbs as soon as they burn out.  Set up your furniture so you have a clear path. Avoid moving your furniture around.  If any of your floors are uneven, fix them.  If there are any pets around you, be aware of where they are.  Review your medicines with your doctor. Some medicines can make you feel dizzy. This can increase your chance of falling. Ask your doctor what other things that you can do to help prevent falls. This information is not intended to replace advice given to you by your health care provider. Make sure you discuss any questions you have with your health care provider. Document Released: 05/30/2009 Document Revised: 01/09/2016 Document Reviewed: 09/07/2014 Elsevier Interactive Patient Education  2017 [employer].

## 2018-06-13 DIAGNOSIS — F319 Bipolar disorder, unspecified: Secondary | ICD-10-CM | POA: Diagnosis not present

## 2018-06-13 DIAGNOSIS — Z79899 Other long term (current) drug therapy: Secondary | ICD-10-CM | POA: Diagnosis not present

## 2018-08-10 ENCOUNTER — Encounter: Payer: Self-pay | Admitting: *Deleted

## 2018-08-10 ENCOUNTER — Emergency Department
Admission: EM | Admit: 2018-08-10 | Discharge: 2018-08-10 | Disposition: A | Discharge status: Home / Self Care | Payer: No Typology Code available for payment source | Attending: Emergency Medicine | Admitting: Emergency Medicine

## 2018-08-10 ENCOUNTER — Other Ambulatory Visit: Payer: Self-pay

## 2018-08-10 ENCOUNTER — Emergency Department: Payer: No Typology Code available for payment source

## 2018-08-10 DIAGNOSIS — N23 Unspecified renal colic: Secondary | ICD-10-CM | POA: Diagnosis not present

## 2018-08-10 DIAGNOSIS — F17210 Nicotine dependence, cigarettes, uncomplicated: Secondary | ICD-10-CM | POA: Insufficient documentation

## 2018-08-10 DIAGNOSIS — R109 Unspecified abdominal pain: Secondary | ICD-10-CM | POA: Diagnosis present

## 2018-08-10 DIAGNOSIS — N201 Calculus of ureter: Secondary | ICD-10-CM | POA: Diagnosis not present

## 2018-08-10 DIAGNOSIS — Z79899 Other long term (current) drug therapy: Secondary | ICD-10-CM | POA: Diagnosis not present

## 2018-08-10 DIAGNOSIS — R3129 Other microscopic hematuria: Secondary | ICD-10-CM | POA: Diagnosis not present

## 2018-08-10 DIAGNOSIS — R1030 Lower abdominal pain, unspecified: Secondary | ICD-10-CM | POA: Diagnosis not present

## 2018-08-10 LAB — URINALYSIS, COMPLETE (UACMP) WITH MICROSCOPIC
BACTERIA UA: NONE SEEN
Bilirubin Urine: NEGATIVE
Glucose, UA: NEGATIVE mg/dL
Ketones, ur: NEGATIVE mg/dL
Leukocytes, UA: NEGATIVE
NITRITE: NEGATIVE
Protein, ur: 30 mg/dL — AB
RBC / HPF: >50 RBC/hpf — ABNORMAL HIGH (ref 0–5)
SPECIFIC GRAVITY, URINE: 1.021 (ref 1.005–1.030)
SQUAMOUS EPITHELIAL / LPF: NONE SEEN (ref 0–5)
pH: 6 (ref 5.0–8.0)

## 2018-08-10 MED ORDER — FENTANYL CITRATE (PF) 100 MCG/2ML IJ SOLN
INTRAMUSCULAR | Status: AC
  Administered 2018-08-10: 50 ug via INTRAVENOUS
  Filled 2018-08-10: qty 2

## 2018-08-10 MED ORDER — FENTANYL CITRATE (PF) 100 MCG/2ML IJ SOLN
50.0000 ug | INTRAMUSCULAR | Status: AC | PRN
  Administered 2018-08-10 (×2): 50 ug via INTRAVENOUS
  Filled 2018-08-10: qty 2

## 2018-08-10 MED ORDER — KETOROLAC TROMETHAMINE 10 MG PO TABS
10.0000 mg | ORAL_TABLET | Freq: Once | ORAL | Status: AC
  Administered 2018-08-10: 10 mg via ORAL
  Filled 2018-08-10: qty 1

## 2018-08-10 MED ORDER — ONDANSETRON HCL 4 MG/2ML IJ SOLN
INTRAMUSCULAR | Status: AC
  Administered 2018-08-10: 4 mg via INTRAVENOUS
  Filled 2018-08-10: qty 2

## 2018-08-10 MED ORDER — KETOROLAC TROMETHAMINE 10 MG PO TABS: 10.0000 mg | ORAL_TABLET | Freq: Four times a day (QID) | ORAL | 0 refills | Status: DC | PRN

## 2018-08-10 MED ORDER — ONDANSETRON HCL 4 MG/2ML IJ SOLN
4.0000 mg | Freq: Once | INTRAMUSCULAR | Status: AC
  Administered 2018-08-10: 4 mg via INTRAVENOUS

## 2018-08-10 NOTE — Discharge Instructions (Addendum)
Your CT scan shows a 3 mm kidney stone that is in the process of being passed through the urethra.  Drink lots of water to stay hydrated and increase your urine output to help finish passing the stone.  Your urine test shows blood in the urine, which is almost certainly due to the kidney stone.  You should follow-up with your doctor in 1 week for a repeat urine test to ensure that the blood in the urine has resolved after the stone is passed.

## 2018-08-10 NOTE — ED Notes (Signed)
Blood sent

## 2018-08-10 NOTE — ED Provider Notes (Signed)
[facility] Emergency Department Provider Note  ____________________________________________  Time seen: Approximately 9:38 PM  I have reviewed the triage vital signs and the nursing notes.   HISTORY  Chief Complaint Flank Pain    HPI Frederick [name] is a 42 y.o. male with a history of GERD who comes the ED complaining of right flank pain radiating to the right testicle since about noon today.  Constant, waxing and waning, no aggravating or alleviating factors.  Moderate intensity.  No dysuria frequency urgency or hematuria.  Denies any history of kidney stones.  No fevers chills or vomiting.    Past Medical History:  Diagnosis Date  . Allergy   . Anxiety   . Arthritis   . Bipolar disorder (HCC)   . GERD (gastroesophageal reflux disease)      Patient Active Problem List   Diagnosis Date Noted  . Long-term use of high-risk medication 04/05/2018  . Anxiety 04/04/2018  . GERD (gastroesophageal reflux disease) 09/30/2016  . Bipolar disorder, unspecified (HCC) 01/04/2012     Past Surgical History:  Procedure Laterality Date  . PILONIDAL CYST EXCISION       Prior to Admission medications   Medication Sig Start Date End Date Taking? Authorizing Provider  clonazePAM (KLONOPIN) 2 MG tablet Take 1 tablet (2 mg total) by mouth 4 (four) times daily as needed for anxiety. 09/06/13 05/26/18  Plovsky, Earvin HansenGerald, MD  ketorolac (TORADOL) 10 MG tablet Take 1 tablet (10 mg total) by mouth every 6 (six) hours as needed for moderate pain. 08/10/18   Sharman CheekStafford, Christyanna Mckeon, MD  lithium carbonate (ESKALITH) 450 MG CR tablet Take one tablet by mouth in the morning and two tablets by mouth every evening 09/06/13   Plovsky, Earvin HansenGerald, MD  Oxcarbazepine (TRILEPTAL) 300 MG tablet Take one tablet each morning, and two at bedtime. 09/06/13   Plovsky, Earvin HansenGerald, MD  pantoprazole (PROTONIX) 40 MG tablet Take 1 tablet (40 mg total) by mouth daily. 05/26/18   Erasmo DownerBacigalupo, Angela M, MD   topiramate (TOPAMAX) 50 MG tablet Take 50 mg by mouth 2 (two) times daily.    [provider]     Allergies Alprazolam; Bee venom; Clindamycin; and Ziprasidone hcl   Family History  Problem Relation Age of Onset  . Bipolar disorder Mother   . Arthritis Father   . Bipolar disorder Brother   . Bipolar disorder Maternal Uncle   . Bipolar disorder Paternal Uncle   . Cancer Paternal Grandmother        lymphoma?    Social History Social History   Tobacco Use  . Smoking status: Current Every Day Smoker    Packs/day: 0.25    Years: 10.00    Pack years: 2.50    Types: Cigarettes  . Smokeless tobacco: Never Used  . Tobacco comment: smokes about 2-3 a day  Substance Use Topics  . Alcohol use: Yes    Alcohol/week: 1.0 - 2.0 standard drinks    Types: 1 - 2 Cans of beer per week  . Drug use: No    Review of Systems  Constitutional:   No fever or chills.  ENT:   No sore throat. No rhinorrhea. Cardiovascular:   No chest pain or syncope. Respiratory:   No dyspnea or cough. Gastrointestinal:   Negative for abdominal pain, vomiting and diarrhea.  Musculoskeletal:   Negative for focal pain or swelling.  Right flank pain as above All other systems reviewed and are negative except as documented above in ROS and HPI.  ____________________________________________   PHYSICAL EXAM:  VITAL SIGNS: ED Triage Vitals  Enc Vitals Group     BP 08/10/18 1744 (!) 125/97     Pulse Rate 08/10/18 1744 72     Resp 08/10/18 1437 16     Temp 08/10/18 2134 97.9 F (36.6 C)     Temp Source 08/10/18 2134 Oral     SpO2 08/10/18 1437 98 %     Weight 08/10/18 1438 240 lb (108.9 kg)     Height 08/10/18 1438 6\' 2"  (1.88 m)     Head Circumference --      Peak Flow --      Pain Score 08/10/18 1437 10     Pain Loc --      Pain Edu? --      Excl. in GC? --     Vital signs reviewed, nursing assessments reviewed.   Constitutional:   Alert and oriented. Non-toxic appearance. Eyes:    Conjunctivae are normal. EOMI. PERRL. ENT      Head:   Normocephalic and atraumatic.      Nose:   No congestion/rhinnorhea.       Mouth/Throat:   MMM, no pharyngeal erythema. No peritonsillar mass.       Neck:   No meningismus. Full ROM. Hematological/Lymphatic/Immunilogical:   No cervical lymphadenopathy. Cardiovascular:   RRR. Symmetric bilateral radial and DP pulses.  No murmurs. Cap refill less than 2 seconds. Respiratory:   Normal respiratory effort without tachypnea/retractions. Breath sounds are clear and equal bilaterally. No wheezes/rales/rhonchi. Gastrointestinal:   Soft with mild right lower quadrant tenderness, not at McBurney's point. Non distended. There is no CVA tenderness.  No rebound, rigidity, or guarding.  Musculoskeletal:   Normal range of motion in all extremities. No joint effusions.  No lower extremity tenderness.  No edema. Neurologic:   Normal speech and language.  Motor grossly intact. No acute focal neurologic deficits are appreciated.  Skin:    Skin is warm, dry and intact. No rash noted.  No petechiae, purpura, or bullae.  ____________________________________________    LABS (pertinent positives/negatives) (all labs ordered are listed, but only abnormal results are displayed) Labs Reviewed  URINALYSIS, COMPLETE (UACMP) WITH MICROSCOPIC - Abnormal; Notable for the following components:      Result Value   Color, Urine YELLOW (*)    APPearance CLEAR (*)    Hgb urine dipstick LARGE (*)    Protein, ur 30 (*)    RBC / HPF >50 (*)    All other components within normal limits  BASIC METABOLIC PANEL  CBC WITH DIFFERENTIAL/PLATELET   ____________________________________________   EKG    ____________________________________________    RADIOLOGY  Ct Renal Stone Study  Result Date: 08/10/2018 CLINICAL DATA:  Right flank pain 2 hours radiating to right groin. EXAM: CT ABDOMEN AND PELVIS WITHOUT CONTRAST TECHNIQUE: Multidetector CT imaging of the  abdomen and pelvis was performed following the standard protocol without IV contrast. COMPARISON:  None. FINDINGS: Lower chest: Lung bases demonstrate linear atelectasis/scarring over the right base. Hepatobiliary: Liver, gallbladder and biliary tree are normal. Pancreas: Normal. Spleen: Normal. Adrenals/Urinary Tract: Adrenal glands are normal. Kidneys normal in size without hydronephrosis or nephrolithiasis. There is subtle prominence of the right intrarenal collecting system. There is a nonobstructing punctate stone over the lower pole left kidney. Minimal prominence of the right ureter without stones. Left ureter is normal. Bladder is normal. There is a 3 mm calcification projecting over the expected region of the mid urethra. Stomach/Bowel: Stomach and  small bowel are normal. Appendix is normal. Mild diverticulosis of the colon. Vascular/Lymphatic: Normal. Reproductive: Normal. Other: No free fluid or focal inflammatory change. Musculoskeletal: Unremarkable. IMPRESSION: Mild prominence of the right intrarenal collecting system and ureter without evidence of renal or ureteral stones. There is a 3 mm stone over the mid urethra likely passed from the right collecting system. Mild colonic diverticulosis. Electronically Signed   By: Elberta Fortisaniel  Boyle M.D.   On: 08/10/2018 19:06    ____________________________________________   PROCEDURES Procedures  ____________________________________________  DIFFERENTIAL DIAGNOSIS   Kidney stone, pyelonephritis, appendicitis  CLINICAL IMPRESSION / ASSESSMENT AND PLAN / ED COURSE  Pertinent labs & imaging results that were available during my care of the patient were reviewed by me and considered in my medical decision making (see chart for details).    Patient is nontoxic, vital signs unremarkable, presents with right flank pain.  He does not have a known history of kidney stones and with it being right-sided with some abdominal tenderness will obtain a CT scan to  evaluate for appendicitis and rule out obstructing stone  Clinical Course as of Aug 10 2137  Wed Aug 10, 2018  2014 CT scan shows a 3 mm stone in the urethra, likely just passed from his right sided ureter.  Can expect symptoms to be significantly improved at this point without further care.  Will recommend NSAIDs and follow-up with primary care in a week to ensure that his microscopic hematuria has resolved   [PS]    Clinical Course User Index [PS] Sharman CheekStafford, Axie Hayne, MD      ____________________________________________   FINAL CLINICAL IMPRESSION(S) / ED DIAGNOSES    Final diagnoses:  Microscopic hematuria  Right flank pain  Ureteral colic     ED Discharge Orders         Ordered    ketorolac (TORADOL) 10 MG tablet  Every 6 hours PRN     08/10/18 2136          Portions of this note were generated with dragon dictation software. Dictation errors may occur despite best attempts at proofreading.   Sharman CheekStafford, Gaelyn Tukes, MD 08/10/18 2155

## 2018-08-10 NOTE — ED Notes (Signed)
Pt verbalized understanding of d/c instructions, rx and f/u care, no further questions at this time. Pt ambulatory to the exit with steady gait.

## 2018-08-10 NOTE — ED Notes (Signed)
Pt updated on Ct results at this time. MD has discussed pain control with patient and plan for d/c. VSS at this time.

## 2018-08-10 NOTE — ED Triage Notes (Signed)
PT to ED reporting right sided flank pain x 2 hours. Pain radiating ton groin. Pt able to urine since pain started but reported he did not urinate a lot. No hx of kidney staines. No cardiac hx.   Right arm BP:  Left arm BP: 125/91

## 2018-08-19 ENCOUNTER — Encounter: Payer: Medicare Other | Admitting: Family Medicine

## 2018-09-07 ENCOUNTER — Encounter: Payer: Self-pay | Admitting: Family Medicine

## 2018-09-07 ENCOUNTER — Ambulatory Visit (INDEPENDENT_AMBULATORY_CARE_PROVIDER_SITE_OTHER): Payer: No Typology Code available for payment source | Admitting: Family Medicine

## 2018-09-07 VITALS — BP 129/87 | HR 71 | Temp 97.5°F | Wt 249.8 lb

## 2018-09-07 DIAGNOSIS — K219 Gastro-esophageal reflux disease without esophagitis: Secondary | ICD-10-CM

## 2018-09-07 DIAGNOSIS — E538 Deficiency of other specified B group vitamins: Secondary | ICD-10-CM | POA: Diagnosis not present

## 2018-09-07 DIAGNOSIS — Z0000 Encounter for general adult medical examination without abnormal findings: Secondary | ICD-10-CM | POA: Diagnosis not present

## 2018-09-07 DIAGNOSIS — R5382 Chronic fatigue, unspecified: Secondary | ICD-10-CM

## 2018-09-07 DIAGNOSIS — E559 Vitamin D deficiency, unspecified: Secondary | ICD-10-CM | POA: Diagnosis not present

## 2018-09-07 DIAGNOSIS — F3177 Bipolar disorder, in partial remission, most recent episode mixed: Secondary | ICD-10-CM

## 2018-09-07 DIAGNOSIS — Z79899 Other long term (current) drug therapy: Secondary | ICD-10-CM | POA: Diagnosis not present

## 2018-09-07 NOTE — Progress Notes (Signed)
Patient: Frederick [name], Male    DOB: 06/16/1976, 43 y.o.   MRN: [id number] Visit Date: 09/07/2018  Today's Provider: Shirlee LatchAngela Bacigalupo, MD   Chief Complaint  Patient presents with  . Annual Exam   Subjective:  I, Frederick [name], CMA, am acting as a scribe for Shirlee LatchAngela Bacigalupo, MD.    Annual physical exam Frederick ShihWilliam [name] is a 43 y.o. male who presents today for health maintenance and complete physical. He feels fairly well. He reports exercising includes walking. He reports he is sleeping fairly well.  Has been feeling more fatigued.  Thinks this is related to starting Topamax.  He was getting Vit B shots in the past and previously told he needed to take Vit D.  He had quit smoking and was vaping instead, but after seeing reports of health risks of vaping, he has quit this and started smoking again, but much less than previosuly -----------------------------------------------------------------   Review of Systems  Constitutional: Positive for fatigue and unexpected weight change.  HENT: Negative.   Eyes: Positive for photophobia.  Respiratory: Negative.   Cardiovascular: Negative.   Gastrointestinal: Negative.   Endocrine: Negative.   Genitourinary: Negative.   Musculoskeletal: Positive for arthralgias, myalgias and neck stiffness.  Skin: Negative.   Allergic/Immunologic: Positive for environmental allergies and food allergies.  Neurological: Positive for dizziness, tremors, weakness, light-headedness and headaches.  Hematological: Negative.   Psychiatric/Behavioral: Positive for agitation, behavioral problems, decreased concentration, dysphoric mood and sleep disturbance. The patient is nervous/anxious and is hyperactive.     Social History He  reports that he has been smoking cigarettes. He has a 2.50 pack-year smoking history. He has never used smokeless tobacco. He reports current alcohol use of about 1.0 - 2.0 standard drinks of alcohol per week. He reports  that he does not use drugs. Social History   Socioeconomic History  . Marital status: Married    Spouse name: Not on file  . Number of children: 2  . Years of education: Not on file  . Highest education level: Associate degree: occupational, Scientist, product/process developmenttechnical, or vocational program  Occupational History  . Occupation: disability  Social Needs  . Financial resource strain: Not hard at all  . Food insecurity:    Worry: Never true    Inability: Never true  . Transportation needs:    Medical: No    Non-medical: No  Tobacco Use  . Smoking status: Current Every Day Smoker    Packs/day: 0.25    Years: 10.00    Pack years: 2.50    Types: Cigarettes  . Smokeless tobacco: Never Used  . Tobacco comment: smokes about 2-3 a day  Substance and Sexual Activity  . Alcohol use: Yes    Alcohol/week: 1.0 - 2.0 standard drinks    Types: 1 - 2 Cans of beer per week  . Drug use: No  . Sexual activity: Yes    Partners: Female  Lifestyle  . Physical activity:    Days per week: Not on file    Minutes per session: Not on file  . Stress: Very much  Relationships  . Social connections:    Talks on phone: Patient refused    Gets together: Patient refused    Attends religious service: Patient refused    Active member of club or organization: Patient refused    Attends meetings of clubs or organizations: Patient refused    Relationship status: Patient refused  Other Topics Concern  . Not on file  Social History  Narrative   Married.   2 children.   Disabled.   Enjoys playing disc golf.     Patient Active Problem List   Diagnosis Date Noted  . Long-term use of high-risk medication 04/05/2018  . Anxiety 04/04/2018  . GERD (gastroesophageal reflux disease) 09/30/2016  . Bipolar disorder, unspecified (HCC) 01/04/2012    Past Surgical History:  Procedure Laterality Date  . PILONIDAL CYST EXCISION      Family History  Family Status  Relation Name Status  . Mother  Alive  . Father  Alive    . Brother  Alive  . Mat Uncle  (Not Specified)  . Oneal Grout  (Not Specified)  . MGM  Deceased  . MGF  Deceased  . PGM  Deceased  . PGF  Deceased   His family history includes Arthritis in his father; Bipolar disorder in his brother, maternal uncle, mother, and paternal uncle; Cancer in his paternal grandmother.     Allergies  Allergen Reactions  . Alprazolam Itching and Nausea And Vomiting  . Bee Venom   . Clindamycin Hives  . Ziprasidone Hcl Itching    Previous Medications   CLONAZEPAM (KLONOPIN) 2 MG TABLET    Take 1 tablet (2 mg total) by mouth 4 (four) times daily as needed for anxiety.   KETOROLAC (TORADOL) 10 MG TABLET    Take 1 tablet (10 mg total) by mouth every 6 (six) hours as needed for moderate pain.   LITHIUM CARBONATE (ESKALITH) 450 MG CR TABLET    Take one tablet by mouth in the morning and two tablets by mouth every evening   OXCARBAZEPINE (TRILEPTAL) 300 MG TABLET    Take one tablet each morning, and two at bedtime.   PANTOPRAZOLE (PROTONIX) 40 MG TABLET    Take 1 tablet (40 mg total) by mouth daily.   TOPIRAMATE (TOPAMAX) 50 MG TABLET    Take 50 mg by mouth 2 (two) times daily.    Patient Care Team: Erasmo Downer, MD as PCP - General (Family Medicine) Lynett Fish, MD as Referring Physician (Psychiatry)      Objective:   Vitals: BP 129/87 (BP Location: Left Arm, Patient Position: Sitting, Cuff Size: Large)   Pulse 71   Temp (!) 97.5 F (36.4 C) (Oral)   Wt 249 lb 12.8 oz (113.3 kg)   SpO2 96%   BMI 32.07 kg/m    Physical Exam Vitals signs reviewed.  Constitutional:      General: He is not in acute distress.    Appearance: Normal appearance. He is well-developed. He is not diaphoretic.  HENT:     Head: Normocephalic and atraumatic.     Right Ear: Tympanic membrane, ear canal and external ear normal.     Left Ear: External ear normal.     Nose: Nose normal.     Mouth/Throat:     Mouth: Mucous membranes are moist.     Pharynx: Oropharynx is  clear. No oropharyngeal exudate.  Eyes:     General: No scleral icterus.    Conjunctiva/sclera: Conjunctivae normal.     Pupils: Pupils are equal, round, and reactive to light.  Neck:     Musculoskeletal: Neck supple.     Thyroid: No thyromegaly.  Cardiovascular:     Rate and Rhythm: Normal rate and regular rhythm.     Pulses: Normal pulses.     Heart sounds: Normal heart sounds. No murmur.  Pulmonary:     Effort: Pulmonary effort is normal. No respiratory  distress.     Breath sounds: Normal breath sounds. No wheezing, rhonchi or rales.  Abdominal:     General: Bowel sounds are normal. There is no distension.     Palpations: Abdomen is soft.     Tenderness: There is no abdominal tenderness.  Musculoskeletal:        General: No deformity.     Right lower leg: No edema.     Left lower leg: No edema.  Lymphadenopathy:     Cervical: No cervical adenopathy.  Skin:    General: Skin is warm and dry.     Capillary Refill: Capillary refill takes less than 2 seconds.     Findings: No rash.  Neurological:     Mental Status: He is alert and oriented to person, place, and time.  Psychiatric:        Mood and Affect: Affect is flat.        Speech: Speech is delayed.        Behavior: Behavior is slowed. Behavior is cooperative.        Thought Content: Thought content does not include homicidal or suicidal ideation.      Depression Screen PHQ 2/9 Scores 09/07/2018 05/26/2018 04/04/2018 02/05/2017  PHQ - 2 Score 2 1 1 1   PHQ- 9 Score 11 9 6  -    Assessment & Plan:     Routine Health Maintenance and Physical Exam  Exercise Activities and Dietary recommendations Goals    . Have 3 meals a day     Recommend to decrease portion sizes by eating 3 small healthy meals and at least 2 healthy snacks per day.         . weight management     Target weight is 200lbs. Starting 02/05/17, I will continue to play disc golf for at least 2 hours or more as schedule permits.         Immunization History  Administered Date(s) Administered  . Influenza,inj,Quad PF,6+ Mos 04/29/2016, 05/26/2018  . Influenza-Unspecified 04/29/2016    Health Maintenance  Topic Date Due  . TETANUS/TDAP  02/06/2027 (Originally 01/23/1993)  . HIV Screening  02/05/2099 (Originally 01/23/1989)  . INFLUENZA VACCINE  Completed     Discussed health benefits of physical activity, and encouraged him to engage in regular exercise appropriate for his age and condition.    --------------------------------------------------------------------  Problem List Items Addressed This Visit      Digestive   GERD (gastroesophageal reflux disease)     Other   Bipolar disorder, unspecified (HCC)   Long-term use of high-risk medication   Relevant Orders   Basic Metabolic Panel (BMET)    Other Visit Diagnoses    Encounter for annual physical exam    -  Primary   Chronic fatigue       B12 deficiency       Relevant Orders   B12   Avitaminosis D       Relevant Orders   VITAMIN D 25 Hydroxy (Vit-D Deficiency, Fractures)       Return in about 9 months (around 06/08/2019) for AWV/chronic disease f/u.   The entirety of the information documented in the History of Present Illness, Review of Systems and Physical Exam were personally obtained by me. Portions of this information were initially documented by [name], CMA and reviewed by me for thoroughness and accuracy.    Erasmo DownerBacigalupo, Angela M, MD, MPH [facility] 09/07/2018 11:27 AM

## 2018-09-07 NOTE — Patient Instructions (Signed)
Preventive Care 18-39 Years, Male Preventive care refers to lifestyle choices and visits with your health care provider that can promote health and wellness. What does preventive care include?   A yearly physical exam. This is also called an annual well check.  Dental exams once or twice a year.  Routine eye exams. Ask your health care provider how often you should have your eyes checked.  Personal lifestyle choices, including: ? Daily care of your teeth and gums. ? Regular physical activity. ? Eating a healthy diet. ? Avoiding tobacco and drug use. ? Limiting alcohol use. ? Practicing safe sex. What happens during an annual well check? The services and screenings done by your health care provider during your annual well check will depend on your age, overall health, lifestyle risk factors, and family history of disease. Counseling Your health care provider may ask you questions about your:  Alcohol use.  Tobacco use.  Drug use.  Emotional well-being.  Home and relationship well-being.  Sexual activity.  Eating habits.  Work and work environment. Screening You may have the following tests or measurements:  Height, weight, and BMI.  Blood pressure.  Lipid and cholesterol levels. These may be checked every 5 years starting at age 20.  Diabetes screening. This is done by checking your blood sugar (glucose) after you have not eaten for a while (fasting).  Skin check.  Hepatitis C blood test.  Hepatitis B blood test.  Sexually transmitted disease (STD) testing. Discuss your test results, treatment options, and if necessary, the need for more tests with your health care provider. Vaccines Your health care provider may recommend certain vaccines, such as:  Influenza vaccine. This is recommended every year.  Tetanus, diphtheria, and acellular pertussis (Tdap, Td) vaccine. You may need a Td booster every 10 years.  Varicella vaccine. You may need this if you  have not been vaccinated.  HPV vaccine. If you are 26 or younger, you may need three doses over 6 months.  Measles, mumps, and rubella (MMR) vaccine. You may need at least one dose of MMR.You may also need a second dose.  Pneumococcal 13-valent conjugate (PCV13) vaccine. You may need this if you have certain conditions and have not been vaccinated.  Pneumococcal polysaccharide (PPSV23) vaccine. You may need one or two doses if you smoke cigarettes or if you have certain conditions.  Meningococcal vaccine. One dose is recommended if you are age 19-21 years and a first-year college student living in a residence hall, or if you have one of several medical conditions. You may also need additional booster doses.  Hepatitis A vaccine. You may need this if you have certain conditions or if you travel or work in places where you may be exposed to hepatitis A.  Hepatitis B vaccine. You may need this if you have certain conditions or if you travel or work in places where you may be exposed to hepatitis B.  Haemophilus influenzae type b (Hib) vaccine. You may need this if you have certain risk factors. Talk to your health care provider about which screenings and vaccines you need and how often you need them. This information is not intended to replace advice given to you by your health care provider. Make sure you discuss any questions you have with your health care provider. Document Released: 09/29/2001 Document Revised: 03/16/2017 Document Reviewed: 06/04/2015 Elsevier Interactive Patient Education  2019 [employer] Inc[employer]  

## 2018-09-08 LAB — BASIC METABOLIC PANEL
BUN/Creatinine Ratio: 13 (ref 9–20)
BUN: 13 mg/dL (ref 6–24)
CO2: 22 mmol/L (ref 20–29)
Calcium: 9.4 mg/dL (ref 8.7–10.2)
Chloride: 106 mmol/L (ref 96–106)
Creatinine, Ser: 0.97 mg/dL (ref 0.76–1.27)
GFR, EST AFRICAN AMERICAN: 109 mL/min/{1.73_m2} (ref >59)
GFR, EST NON AFRICAN AMERICAN: 95 mL/min/{1.73_m2} (ref >59)
Glucose: 102 mg/dL — ABNORMAL HIGH (ref 65–99)
Potassium: 4.2 mmol/L (ref 3.5–5.2)
Sodium: 143 mmol/L (ref 134–144)

## 2018-09-08 LAB — VITAMIN B12: Vitamin B-12: 856 pg/mL (ref 232–1245)

## 2018-09-08 LAB — VITAMIN D 25 HYDROXY (VIT D DEFICIENCY, FRACTURES): Vit D, 25-Hydroxy: 12 ng/mL — ABNORMAL LOW (ref 30.0–100.0)

## 2018-10-11 DIAGNOSIS — F319 Bipolar disorder, unspecified: Secondary | ICD-10-CM | POA: Diagnosis not present

## 2019-01-17 DIAGNOSIS — F319 Bipolar disorder, unspecified: Secondary | ICD-10-CM | POA: Diagnosis not present

## 2019-04-06 ENCOUNTER — Ambulatory Visit: Payer: Medicare Other | Admitting: Family Medicine

## 2019-04-10 ENCOUNTER — Encounter: Payer: Self-pay | Admitting: Family Medicine

## 2019-05-15 ENCOUNTER — Encounter: Payer: Self-pay | Admitting: Family Medicine

## 2019-05-15 DIAGNOSIS — F3177 Bipolar disorder, in partial remission, most recent episode mixed: Secondary | ICD-10-CM

## 2019-05-15 DIAGNOSIS — F419 Anxiety disorder, unspecified: Secondary | ICD-10-CM

## 2019-05-30 ENCOUNTER — Ambulatory Visit: Payer: Self-pay | Admitting: Family Medicine

## 2019-05-30 ENCOUNTER — Ambulatory Visit: Payer: No Typology Code available for payment source

## 2019-06-08 ENCOUNTER — Encounter: Payer: Self-pay | Admitting: Family Medicine

## 2019-06-08 ENCOUNTER — Other Ambulatory Visit: Payer: Self-pay

## 2019-06-08 ENCOUNTER — Ambulatory Visit (INDEPENDENT_AMBULATORY_CARE_PROVIDER_SITE_OTHER): Payer: No Typology Code available for payment source | Admitting: Family Medicine

## 2019-06-08 VITALS — BP 117/88 | HR 69 | Temp 96.6°F | Wt 243.0 lb

## 2019-06-08 DIAGNOSIS — F419 Anxiety disorder, unspecified: Secondary | ICD-10-CM

## 2019-06-08 DIAGNOSIS — Z79899 Other long term (current) drug therapy: Secondary | ICD-10-CM | POA: Diagnosis not present

## 2019-06-08 DIAGNOSIS — K219 Gastro-esophageal reflux disease without esophagitis: Secondary | ICD-10-CM | POA: Diagnosis not present

## 2019-06-08 DIAGNOSIS — Z23 Encounter for immunization: Secondary | ICD-10-CM | POA: Diagnosis not present

## 2019-06-08 DIAGNOSIS — M25421 Effusion, right elbow: Secondary | ICD-10-CM | POA: Insufficient documentation

## 2019-06-08 DIAGNOSIS — F3177 Bipolar disorder, in partial remission, most recent episode mixed: Secondary | ICD-10-CM

## 2019-06-08 DIAGNOSIS — M25521 Pain in right elbow: Secondary | ICD-10-CM

## 2019-06-08 MED ORDER — PANTOPRAZOLE SODIUM 40 MG PO TBEC: 40.0000 mg | DELAYED_RELEASE_TABLET | Freq: Every day | ORAL | 1 refills | Status: AC

## 2019-06-08 NOTE — Assessment & Plan Note (Signed)
Upcoming appt with new Psychiatrist Continue current meds 

## 2019-06-08 NOTE — Progress Notes (Signed)
Patient: Frederick [name] Male    DOB: 1975-10-13   43 y.o.   MRN: [id number] Visit Date: 06/08/2019  Today's Provider: Lavon Paganini, MD   Chief Complaint  Patient presents with  . Elbow Injury    Right elbow; playing disc golf about a month ago.  Marland Kitchen Anxiety   Subjective:     Arm Injury  Incident onset: About a month ago playing disc golf. The pain is present in the right elbow. The quality of the pain is described as cramping and aching. The pain does not radiate. Pertinent negatives include no numbness or tingling. Nothing aggravates the symptoms. He has tried NSAIDs for the symptoms. The treatment provided no relief.  Anxiety Presents for follow-up visit. Symptoms include excessive worry, insomnia and nervous/anxious behavior. Patient reports no decreased concentration, dizziness, nausea, panic, restlessness or shortness of breath. The quality of sleep is poor.    Gastroesophageal Reflux He reports no abdominal pain or no nausea. He has tried a PPI for the symptoms.   R elbow  Pop when it occured and pain initially Tingling to fingertips initially - now resolved Pain remains in medial elbow   Allergies  Allergen Reactions  . Alprazolam Itching and Nausea And Vomiting  . Bee Venom   . Clindamycin Hives  . Ziprasidone Hcl Itching     Current Outpatient Medications:  .  clonazePAM (KLONOPIN) 2 MG tablet, Take 1 tablet (2 mg total) by mouth 4 (four) times daily as needed for anxiety., Disp: 120 tablet, Rfl: 1 .  ketorolac (TORADOL) 10 MG tablet, Take 1 tablet (10 mg total) by mouth every 6 (six) hours as needed for moderate pain., Disp: 8 tablet, Rfl: 0 .  lithium carbonate (ESKALITH) 450 MG CR tablet, Take one tablet by mouth in the morning and two tablets by mouth every evening, Disp: 270 tablet, Rfl: 1 .  Oxcarbazepine (TRILEPTAL) 300 MG tablet, Take one tablet each morning, and two at bedtime., Disp: 270 tablet, Rfl: 0 .  pantoprazole (PROTONIX) 40 MG tablet,  Take 1 tablet (40 mg total) by mouth daily., Disp: 30 tablet, Rfl: 11 .  topiramate (TOPAMAX) 50 MG tablet, Take 50 mg by mouth 2 (two) times daily., Disp: , Rfl:   Review of Systems  Constitutional: Negative.   Respiratory: Negative.  Negative for shortness of breath.   Cardiovascular: Negative.   Gastrointestinal: Positive for abdominal distention. Negative for abdominal pain, anal bleeding, blood in stool, constipation, diarrhea, nausea, rectal pain and vomiting.  Musculoskeletal: Positive for arthralgias. Negative for back pain, gait problem, joint swelling, myalgias, neck pain and neck stiffness.  Neurological: Negative for dizziness, tingling, light-headedness, numbness and headaches.  Psychiatric/Behavioral: Negative for decreased concentration. The patient is nervous/anxious and has insomnia.     Social History   Tobacco Use  . Smoking status: Current Every Day Smoker    Packs/day: 0.25    Years: 10.00    Pack years: 2.50    Types: Cigarettes  . Smokeless tobacco: Never Used  . Tobacco comment: smokes about 2-3 a day  Substance Use Topics  . Alcohol use: Yes    Alcohol/week: 1.0 - 2.0 standard drinks    Types: 1 - 2 Cans of beer per week      Objective:   BP 117/88 (BP Location: Right Arm, Patient Position: Sitting, Cuff Size: Large)   Pulse 69   Temp (!) 96.6 F (35.9 C) (Temporal)   Wt 243 lb (110.2 kg)   BMI  31.20 kg/m  Vitals:   06/08/19 1332  BP: 117/88  Pulse: 69  Temp: (!) 96.6 F (35.9 C)  TempSrc: Temporal  Weight: 243 lb (110.2 kg)  Body mass index is 31.2 kg/m.   Physical Exam Vitals signs reviewed.  Constitutional:      General: He is not in acute distress.    Appearance: Normal appearance. He is not diaphoretic.  HENT:     Head: Normocephalic and atraumatic.     Right Ear: Tympanic membrane, ear canal and external ear normal.     Left Ear: Tympanic membrane, ear canal and external ear normal.  Eyes:     General: No scleral icterus.     Conjunctiva/sclera: Conjunctivae normal.  Neck:     Musculoskeletal: Neck supple.  Cardiovascular:     Rate and Rhythm: Normal rate and regular rhythm.     Pulses: Normal pulses.     Heart sounds: Normal heart sounds. No murmur.  Pulmonary:     Effort: Pulmonary effort is normal. No respiratory distress.     Breath sounds: Normal breath sounds. No wheezing or rhonchi.  Abdominal:     General: There is no distension.     Palpations: Abdomen is soft.     Tenderness: There is no abdominal tenderness.  Musculoskeletal:     Right lower leg: No edema.     Left lower leg: No edema.     Comments: R Elbow: ROM intact, except unable to fully extend. TTP over lateral elbow.  Seems to have effusion.  No olecranon bursitis.  Lymphadenopathy:     Cervical: No cervical adenopathy.  Skin:    General: Skin is warm and dry.     Capillary Refill: Capillary refill takes less than 2 seconds.     Findings: No rash.  Neurological:     Mental Status: He is alert and oriented to person, place, and time. Mental status is at baseline.     Cranial Nerves: No cranial nerve deficit.     Sensory: No sensory deficit.     Motor: No weakness.  Psychiatric:        Mood and Affect: Mood normal.        Behavior: Behavior normal.      No results found for any visits on 06/08/19.     Assessment & Plan   Problem List Items Addressed This Visit      Digestive   GERD (gastroesophageal reflux disease) - Primary    Chronic, well controlled, asymptomatic Continue PPI Discussed importance of diet and exercise      Relevant Medications   pantoprazole (PROTONIX) 40 MG tablet   Other Relevant Orders   CBC w/Diff/Platelet   B12     Other   Bipolar disorder, unspecified (HCC)    Upcoming appt with new Psychiatrist Continue current meds      Anxiety    Upcoming appt with new Psychiatrist Continue current meds      Long-term use of high-risk medication    As he is on lithium, will check metabolic labs  - CBC, CMP, lipids, A1c As on long term PPI, check CMP and B12      Relevant Orders   TSH   Lipid panel   Comprehensive metabolic panel   Hemoglobin A1c   CBC w/Diff/Platelet   VITAMIN D 25 Hydroxy (Vit-D Deficiency, Fractures)   B12   Pain and swelling of right elbow    New problem Seems to have effusion and loss of some ROM  Will refer to Ortho Can use ice and NSAIDs in the meantime      Relevant Orders   Ambulatory referral to Orthopedic Surgery    Other Visit Diagnoses    Need for influenza vaccination       Relevant Orders   Flu Vaccine QUAD 6+ mos PF IM (Fluarix Quad PF) (Completed)       Return in about 6 months (around 12/07/2019) for CPE.   The entirety of the information documented in the History of Present Illness, Review of Systems and Physical Exam were personally obtained by me. Portions of this information were initially documented by Kavin Leech, CMA and reviewed by me for thoroughness and accuracy.    , Marzella Schlein, MD MPH [facility]

## 2019-06-08 NOTE — Assessment & Plan Note (Signed)
Chronic, well controlled, asymptomatic Continue PPI Discussed importance of diet and exercise

## 2019-06-08 NOTE — Assessment & Plan Note (Signed)
Upcoming appt with new Psychiatrist Continue current meds

## 2019-06-08 NOTE — Assessment & Plan Note (Signed)
As he is on lithium, will check metabolic labs - CBC, CMP, lipids, A1c As on long term PPI, check CMP and B12

## 2019-06-08 NOTE — Assessment & Plan Note (Signed)
New problem Seems to have effusion and loss of some ROM Will refer to Ortho Can use ice and NSAIDs in the meantime

## 2019-06-09 LAB — COMPREHENSIVE METABOLIC PANEL
ALT: 59 IU/L — ABNORMAL HIGH (ref 0–44)
AST: 28 IU/L (ref 0–40)
Albumin/Globulin Ratio: 2.3 — ABNORMAL HIGH (ref 1.2–2.2)
Albumin: 4.5 g/dL (ref 4.0–5.0)
Alkaline Phosphatase: 106 IU/L (ref 39–117)
BUN/Creatinine Ratio: 11 (ref 9–20)
BUN: 12 mg/dL (ref 6–24)
Bilirubin Total: 0.3 mg/dL (ref 0.0–1.2)
CO2: 18 mmol/L — ABNORMAL LOW (ref 20–29)
Calcium: 9.3 mg/dL (ref 8.7–10.2)
Chloride: 111 mmol/L — ABNORMAL HIGH (ref 96–106)
Creatinine, Ser: 1.08 mg/dL (ref 0.76–1.27)
GFR calc Af Amer: 95 mL/min/{1.73_m2} (ref >59)
GFR calc non Af Amer: 82 mL/min/{1.73_m2} (ref >59)
Globulin, Total: 2 g/dL (ref 1.5–4.5)
Glucose: 98 mg/dL (ref 65–99)
Potassium: 4.3 mmol/L (ref 3.5–5.2)
Sodium: 141 mmol/L (ref 134–144)
Total Protein: 6.5 g/dL (ref 6.0–8.5)

## 2019-06-09 LAB — CBC WITH DIFFERENTIAL/PLATELET
Basophils Absolute: 0.1 10*3/uL (ref 0.0–0.2)
Basos: 1 %
EOS (ABSOLUTE): 0.2 10*3/uL (ref 0.0–0.4)
Eos: 2 %
Hematocrit: 47.4 % (ref 37.5–51.0)
Hemoglobin: 15.6 g/dL (ref 13.0–17.7)
Immature Grans (Abs): 0.1 10*3/uL (ref 0.0–0.1)
Immature Granulocytes: 1 %
Lymphocytes Absolute: 2.6 10*3/uL (ref 0.7–3.1)
Lymphs: 34 %
MCH: 30.1 pg (ref 26.6–33.0)
MCHC: 32.9 g/dL (ref 31.5–35.7)
MCV: 92 fL (ref 79–97)
Monocytes Absolute: 0.5 10*3/uL (ref 0.1–0.9)
Monocytes: 6 %
Neutrophils Absolute: 4.2 10*3/uL (ref 1.4–7.0)
Neutrophils: 56 %
Platelets: 253 10*3/uL (ref 150–450)
RBC: 5.18 x10E6/uL (ref 4.14–5.80)
RDW: 12.2 % (ref 11.6–15.4)
WBC: 7.6 10*3/uL (ref 3.4–10.8)

## 2019-06-09 LAB — VITAMIN B12: Vitamin B-12: 1579 pg/mL — ABNORMAL HIGH (ref 232–1245)

## 2019-06-09 LAB — HEMOGLOBIN A1C
Est. average glucose Bld gHb Est-mCnc: 108 mg/dL
Hgb A1c MFr Bld: 5.4 % (ref 4.8–5.6)

## 2019-06-09 LAB — TSH: TSH: 1.79 u[IU]/mL (ref 0.450–4.500)

## 2019-06-09 LAB — LIPID PANEL
Chol/HDL Ratio: 5.7 ratio — ABNORMAL HIGH (ref 0.0–5.0)
Cholesterol, Total: 204 mg/dL — ABNORMAL HIGH (ref 100–199)
HDL: 36 mg/dL — ABNORMAL LOW (ref >39)
LDL Chol Calc (NIH): 136 mg/dL — ABNORMAL HIGH (ref 0–99)
Triglycerides: 180 mg/dL — ABNORMAL HIGH (ref 0–149)
VLDL Cholesterol Cal: 32 mg/dL (ref 5–40)

## 2019-06-09 LAB — VITAMIN D 25 HYDROXY (VIT D DEFICIENCY, FRACTURES): Vit D, 25-Hydroxy: 28.4 ng/mL — ABNORMAL LOW (ref 30.0–100.0)

## 2019-06-12 ENCOUNTER — Telehealth: Payer: Self-pay

## 2019-06-12 NOTE — Telephone Encounter (Signed)
-----   Message from Virginia Crews, MD sent at 06/12/2019  1:41 PM EDT ----- Normal thyroid function, kidney function, liver function, electrolytes, blood sugar, blood counts.  Vitamin D level is slightly low.  Is patient taking a supplement?  If not, I recommend OTC vitamin D3 1000 to 2000 units daily.  Vitamin B12 level is slightly elevated.  If he is taking a supplement, recommend cutting back to half that dose.   Cholesterol is high.  10 year risk of heart disease/stroke is high at 7.5%.  This means that the patient would benefit greatly from a statin to lower cholesterol and lower heart disease/stroke risk.  Quitting smoking would also significantly decrease his heart disease and stroke risk.  If patient is agreeable, we can send a prescription for atorvastatin 20 mg daily #30 with 3 refills.  I would recommend that he follow-up in 3 months to have his cholesterol rechecked if we do start a medication.  I also recommend diet low in saturated fat and regular exercise - 30 min at least 5 times per week

## 2019-06-12 NOTE — Telephone Encounter (Signed)
Pt advised.  He wants to work on lifestyle changes including quitting smoking before atorvastatin.  Pt states he will reduce his Vitamin B12 supplement.  He also states he is taking Vitamin D 2,000 units a day.  Do you want him to increase the dose?  Thanks,   -Mickel Baas

## 2019-06-12 NOTE — Progress Notes (Signed)
No show apt. This encounter was created in error - please disregard. 

## 2019-06-13 ENCOUNTER — Other Ambulatory Visit: Payer: Self-pay

## 2019-06-13 NOTE — Telephone Encounter (Signed)
Pt advised of Vitamin D change and states understanding.

## 2019-06-13 NOTE — Telephone Encounter (Signed)
Tried calling, Pt voicemail is not set up.   Thanks,   -Mickel Baas

## 2019-06-13 NOTE — Telephone Encounter (Signed)
Noted.  He can increase his vitamin D to 5000 units a day until we recheck again

## 2019-06-19 NOTE — Progress Notes (Signed)
Subjective:   Frederick [name] is a 43 y.o. male who presents for Medicare Annual/Subsequent preventive examination.    This visit is being conducted through telemedicine due to the COVID-19 pandemic. This patient has given me verbal consent via doximity to conduct this visit, patient states they are participating from their home address. Some vital signs may be absent or patient reported.    Patient identification: identified by name, DOB, and current address  Review of Systems:  N/A  Cardiac Risk Factors include: male gender     Objective:    Vitals: There were no vitals taken for this visit.  There is no height or weight on file to calculate BMI. Unable to obtain vitals due to visit being conducted via telephonically.   Advanced Directives 06/20/2019 08/10/2018 05/26/2018 02/05/2017  Does Patient Have a Medical Advance Directive? Yes No Yes Yes  Type of Estate agentAdvance Directive Healthcare Power of [place];Living will - [place] Power of [place];Living will Healthcare Power of [place];Living will  Copy of Healthcare Power of Attorney in Chart? No - copy requested - No - copy requested Yes  Would patient like information on creating a medical advance directive? - No - Patient declined - -    Tobacco Social History   Tobacco Use  Smoking Status Current Every Day Smoker  . Packs/day: 0.25  . Years: 10.00  . Pack years: 2.50  . Types: Cigarettes  Smokeless Tobacco Never Used  Tobacco Comment   smokes about 2-3 a day     Ready to quit: Yes Counseling given: No Comment: smokes about 2-3 a day   Clinical Intake:  Pre-visit preparation completed: Yes  Pain : No/denies pain Pain Score: 0-No pain     Nutritional Risks: None Diabetes: No  How often do you need to have someone help you when you read instructions, pamphlets, or other written materials from your doctor or pharmacy?: 1 - Never  Interpreter Needed?: No  Information entered by :: [facility]Mmarkoski, LPN  Past  Medical History:  Diagnosis Date  . Allergy   . Anxiety   . Arthritis   . Bipolar disorder (HCC)   . GERD (gastroesophageal reflux disease)    Past Surgical History:  Procedure Laterality Date  . PILONIDAL CYST EXCISION     Family History  Problem Relation Age of Onset  . Bipolar disorder Mother   . Arthritis Father   . Bipolar disorder Brother   . Bipolar disorder Maternal Uncle   . Bipolar disorder Paternal Uncle   . Cancer Paternal Grandmother        lymphoma?   Social History   Socioeconomic History  . Marital status: Married    Spouse name: Not on file  . Number of children: 2  . Years of education: Not on file  . Highest education level: Associate degree: occupational, Scientist, product/process developmenttechnical, or vocational program  Occupational History  . Occupation: disability  Social Needs  . Financial resource strain: Not hard at all  . Food insecurity    Worry: Never true    Inability: Never true  . Transportation needs    Medical: No    Non-medical: No  Tobacco Use  . Smoking status: Current Every Day Smoker    Packs/day: 0.25    Years: 10.00    Pack years: 2.50    Types: Cigarettes  . Smokeless tobacco: Never Used  . Tobacco comment: smokes about 2-3 a day  Substance and Sexual Activity  . Alcohol use: Not Currently    Alcohol/week:  0.0 standard drinks  . Drug use: No  . Sexual activity: Yes    Partners: Female  Lifestyle  . Physical activity    Days per week: 7 days    Minutes per session: 60 min  . Stress: Not at all  Relationships  . Social Herbalist on phone: Patient refused    Gets together: Patient refused    Attends religious service: Patient refused    Active member of club or organization: Patient refused    Attends meetings of clubs or organizations: Patient refused    Relationship status: Patient refused  Other Topics Concern  . Not on file  Social History Narrative   Married.   2 children.   Disabled.   Enjoys playing disc golf.      Outpatient Encounter Medications as of 06/20/2019  Medication Sig  . Cholecalciferol (VITAMIN D3 MAXIMUM STRENGTH) 125 MCG (5000 UT) capsule Take 5,000 Units by mouth daily.  . clonazePAM (KLONOPIN) 2 MG tablet Take 1 tablet (2 mg total) by mouth 4 (four) times daily as needed for anxiety.  . famotidine (PEPCID) 40 MG tablet Take 40 mg by mouth daily. As needed  . lithium carbonate (ESKALITH) 450 MG CR tablet Take one tablet by mouth in the morning and two tablets by mouth every evening  . Oxcarbazepine (TRILEPTAL) 300 MG tablet Take one tablet each morning, and two at bedtime.  . topiramate (TOPAMAX) 50 MG tablet Take 50 mg by mouth 2 (two) times daily.  . pantoprazole (PROTONIX) 40 MG tablet Take 1 tablet (40 mg total) by mouth daily. (Patient not taking: Reported on 06/20/2019)   No facility-administered encounter medications on file as of 06/20/2019.     Activities of Daily Living In your present state of health, do you have any difficulty performing the following activities: 06/20/2019 09/07/2018  Hearing? N N  Vision? N N  Comment Wears eye glasses daily. -  Difficulty concentrating or making decisions? N Y  Walking or climbing stairs? N N  Dressing or bathing? N N  Doing errands, shopping? N N  Preparing Food and eating ? N -  Using the Toilet? N -  In the past six months, have you accidently leaked urine? N -  Do you have problems with loss of bowel control? N -  Managing your Medications? N -  Managing your Finances? N -  Housekeeping or managing your Housekeeping? N -  Some recent data might be hidden    Patient Care Team: Bacigalupo, Dionne Bucy, MD as PCP - General (Family Medicine) [place] Crane, MD as Consulting Physician (Psychiatry)   Assessment:   This is a routine wellness examination for Frederick [name].  Exercise Activities and Dietary recommendations Current Exercise Habits: Home exercise routine, Type of exercise: strength training/weights;Other - see comments(rides a  bicycle), Time (Minutes): 60, Frequency (Times/Week): 7, Weekly Exercise (Minutes/Week): 420, Intensity: Moderate, Exercise limited by: None identified  Goals    . DIET - DECREASE SODA OR JUICE INTAKE     Recommend to cut back to no more than 1 soda a day and increase water intake.     . Have 3 meals a day     Recommend to decrease portion sizes by eating 3 small healthy meals and at least 2 healthy snacks per day.         . weight management     Target weight is 200lbs. Starting 02/05/17, I will continue to play disc golf for at least 2 hours  or more as schedule permits.        Fall Risk: Fall Risk  06/20/2019 06/20/2019 09/07/2018 05/26/2018 04/04/2018  Falls in the past year? 0 0 1 Yes No  Comment - - - - -  Number falls in past yr: 0 0 0 2 or more -  Comment - - - playing disc golf -  Injury with Fall? 0 0 0 No -    FALL RISK PREVENTION PERTAINING TO THE HOME:  Any stairs in or around the home? Yes  If so, are there any without handrails? No   Home free of loose throw rugs in walkways, pet beds, electrical cords, etc? Yes  Adequate lighting in your home to reduce risk of falls? Yes   ASSISTIVE DEVICES UTILIZED TO PREVENT FALLS:  Life alert? No Use of a cane, walker or w/c? No Grab bars in the bathroom? No Shower chair or bench in shower? No Elevated toilet seat or a handicapped toilet? No  TIMED UP AND GO:  Was the test performed? No .    Depression Screen PHQ 2/9 Scores 06/20/2019 09/07/2018 05/26/2018 04/04/2018  PHQ - 2 Score 0 2 1 1   PHQ- 9 Score - 11 9 6     Cognitive Function MMSE - Mini Mental State Exam 02/05/2017  Orientation to time 5  Orientation to Place 5  Registration 3  Attention/ Calculation 0  Recall 3  Language- name 2 objects 0  Language- repeat 1  Language- follow 3 step command 3  Language- read & follow direction 0  Write a sentence 0  Copy design 0  Total score 20     6CIT Screen 06/20/2019 05/26/2018  What Year? 0 points 0  points  What month? 0 points 0 points  What time? 0 points 0 points  Count back from 20 0 points 0 points  Months in reverse 0 points 0 points  Repeat phrase 0 points 0 points  Total Score 0 0    Immunization History  Administered Date(s) Administered  . Influenza,inj,Quad PF,6+ Mos 04/29/2016, 05/26/2018, 06/08/2019  . Influenza-Unspecified 04/29/2016     Tdap: Although this vaccine is not a covered service during a Wellness Exam, does the patient still wish to receive this vaccine today?  No .   Flu Vaccine: Up to date   Screening Tests Health Maintenance  Topic Date Due  . TETANUS/TDAP  02/06/2027 (Originally 01/23/1993)  . HIV Screening  02/05/2099 (Originally 01/23/1989)  . INFLUENZA VACCINE  Completed   Cancer Screenings:  Lung Cancer Screening: (Low Dose CT Chest recommended if Age 21-80 years, 30 pack-year currently smoking OR have quit w/in 15years.) does not qualify.   Additional Screening:  Vision Screening: Recommended annual ophthalmology exams for early detection of glaucoma and other disorders of the eye.  Dental Screening: Recommended annual dental exams for proper oral hygiene  Community Resource Referral:  CRR required this visit?  No        Plan:  I have personally reviewed and addressed the Medicare Annual Wellness questionnaire and have noted the following in the patient's chart:  A. Medical and social history B. Use of alcohol, tobacco or illicit drugs  C. Current medications and supplements D. Functional ability and status E.  Nutritional status F.  Physical activity G. Advance directives H. List of other physicians I.  Hospitalizations, surgeries, and ER visits in previous 12 months J.  Vitals K. Screenings such as hearing and vision if needed, cognitive and depression L. Referrals and appointments  In addition, I have reviewed and discussed with patient certain preventive protocols, quality metrics, and best practice recommendations. A  written personalized care plan for preventive services as well as general preventive health recommendations were provided to patient.   Darrick Huntsman, [place]  30/03/6577 Nurse Health Advisor   Nurse Notes: Pt declined a future order for a tetanus vaccine and the HIV lab test.

## 2019-06-20 ENCOUNTER — Ambulatory Visit (INDEPENDENT_AMBULATORY_CARE_PROVIDER_SITE_OTHER): Payer: No Typology Code available for payment source

## 2019-06-20 ENCOUNTER — Other Ambulatory Visit: Payer: Self-pay

## 2019-06-20 DIAGNOSIS — Z0000 Encounter for general adult medical examination without abnormal findings: Secondary | ICD-10-CM | POA: Diagnosis not present

## 2019-06-20 NOTE — Patient Instructions (Signed)
Frederick [name] , Thank you for taking time to come for your Medicare Wellness Visit. I appreciate your ongoing commitment to your health goals. Please review the following plan we discussed and let me know if I can assist you in the future.   Screening recommendations/referrals: Colonoscopy: Not required until age 45 Recommended yearly ophthalmology/optometry visit for glaucoma screening and checkup Recommended yearly dental visit for hygiene and checkup  Vaccinations: Influenza vaccine: Up to date Tdap vaccine: Pt declines today.   Advanced directives: Please bring a copy of your POA (Power of Attorney) and/or Living Will to your next appointment.   Conditions/risks identified: Recommend to decrease soda intake to no more than 1 a day and increase water intake to help aid with weight loss.   Next appointment: 12/04/18 @ 2:00 PM with Dr Brita Romp  Preventive Care 37 Years and Older, Male Preventive care refers to lifestyle choices and visits with your health care provider that can promote health and wellness. What does preventive care include?  A yearly physical exam. This is also called an annual well check.  Dental exams once or twice a year.  Routine eye exams. Ask your health care provider how often you should have your eyes checked.  Personal lifestyle choices, including:  Daily care of your teeth and gums.  Regular physical activity.  Eating a healthy diet.  Avoiding tobacco and drug use.  Limiting alcohol use.  Practicing safe sex.  Taking low doses of aspirin every day.  Taking vitamin and mineral supplements as recommended by your health care provider. What happens during an annual well check? The services and screenings done by your health care provider during your annual well check will depend on your age, overall health, lifestyle risk factors, and family history of disease. Counseling  Your health care provider may ask you questions about your:  Alcohol  use.  Tobacco use.  Drug use.  Emotional well-being.  Home and relationship well-being.  Sexual activity.  Eating habits.  History of falls.  Memory and ability to understand (cognition).  Work and work Statistician. Screening  You may have the following tests or measurements:  Height, weight, and BMI.  Blood pressure.  Lipid and cholesterol levels. These may be checked every 5 years, or more frequently if you are over 24 years old.  Skin check.  Lung cancer screening. You may have this screening every year starting at age 2 if you have a 30-pack-year history of smoking and currently smoke or have quit within the past 15 years.  Fecal occult blood test (FOBT) of the stool. You may have this test every year starting at age 35.  Flexible sigmoidoscopy or colonoscopy. You may have a sigmoidoscopy every 5 years or a colonoscopy every 10 years starting at age 53.  Prostate cancer screening. Recommendations will vary depending on your family history and other risks.  Hepatitis C blood test.  Hepatitis B blood test.  Sexually transmitted disease (STD) testing.  Diabetes screening. This is done by checking your blood sugar (glucose) after you have not eaten for a while (fasting). You may have this done every 1-3 years.  Abdominal aortic aneurysm (AAA) screening. You may need this if you are a current or former smoker.  Osteoporosis. You may be screened starting at age 67 if you are at high risk. Talk with your health care provider about your test results, treatment options, and if necessary, the need for more tests. Vaccines  Your health care provider may recommend certain vaccines,  such as:  Influenza vaccine. This is recommended every year.  Tetanus, diphtheria, and acellular pertussis (Tdap, Td) vaccine. You may need a Td booster every 10 years.  Zoster vaccine. You may need this after age 66.  Pneumococcal 13-valent conjugate (PCV13) vaccine. One dose is  recommended after age 28.  Pneumococcal polysaccharide (PPSV23) vaccine. One dose is recommended after age 72. Talk to your health care provider about which screenings and vaccines you need and how often you need them. This information is not intended to replace advice given to you by your health care provider. Make sure you discuss any questions you have with your health care provider. Document Released: 08/30/2015 Document Revised: 04/22/2016 Document Reviewed: 06/04/2015 Elsevier Interactive Patient Education  2017 [place] Prevention in the Home Falls can cause injuries. They can happen to people of all ages. There are many things you can do to make your home safe and to help prevent falls. What can I do on the outside of my home?  Regularly fix the edges of walkways and driveways and fix any cracks.  Remove anything that might make you trip as you walk through a door, such as a raised step or threshold.  Trim any bushes or trees on the path to your home.  Use bright outdoor lighting.  Clear any walking paths of anything that might make someone trip, such as rocks or tools.  Regularly check to see if handrails are loose or broken. Make sure that both sides of any steps have handrails.  Any raised decks and porches should have guardrails on the edges.  Have any leaves, snow, or ice cleared regularly.  Use sand or salt on walking paths during winter.  Clean up any spills in your garage right away. This includes oil or grease spills. What can I do in the bathroom?  Use night lights.  Install grab bars by the toilet and in the tub and shower. Do not use towel bars as grab bars.  Use non-skid mats or decals in the tub or shower.  If you need to sit down in the shower, use a plastic, non-slip stool.  Keep the floor dry. Clean up any water that spills on the floor as soon as it happens.  Remove soap buildup in the tub or shower regularly.  Attach bath mats  securely with double-sided non-slip rug tape.  Do not have throw rugs and other things on the floor that can make you trip. What can I do in the bedroom?  Use night lights.  Make sure that you have a light by your bed that is easy to reach.  Do not use any sheets or blankets that are too big for your bed. They should not hang down onto the floor.  Have a firm chair that has side arms. You can use this for support while you get dressed.  Do not have throw rugs and other things on the floor that can make you trip. What can I do in the kitchen?  Clean up any spills right away.  Avoid walking on wet floors.  Keep items that you use a lot in easy-to-reach places.  If you need to reach something above you, use a strong step stool that has a grab bar.  Keep electrical cords out of the way.  Do not use floor polish or wax that makes floors slippery. If you must use wax, use non-skid floor wax.  Do not have throw rugs and other things on  the floor that can make you trip. What can I do with my stairs?  Do not leave any items on the stairs.  Make sure that there are handrails on both sides of the stairs and use them. Fix handrails that are broken or loose. Make sure that handrails are as long as the stairways.  Check any carpeting to make sure that it is firmly attached to the stairs. Fix any carpet that is loose or worn.  Avoid having throw rugs at the top or bottom of the stairs. If you do have throw rugs, attach them to the floor with carpet tape.  Make sure that you have a light switch at the top of the stairs and the bottom of the stairs. If you do not have them, ask someone to add them for you. What else can I do to help prevent falls?  Wear shoes that:  Do not have high heels.  Have rubber bottoms.  Are comfortable and fit you well.  Are closed at the toe. Do not wear sandals.  If you use a stepladder:  Make sure that it is fully opened. Do not climb a closed  stepladder.  Make sure that both sides of the stepladder are locked into place.  Ask someone to hold it for you, if possible.  Clearly mark and make sure that you can see:  Any grab bars or handrails.  First and last steps.  Where the edge of each step is.  Use tools that help you move around (mobility aids) if they are needed. These include:  Canes.  Walkers.  Scooters.  Crutches.  Turn on the lights when you go into a dark area. Replace any light bulbs as soon as they burn out.  Set up your furniture so you have a clear path. Avoid moving your furniture around.  If any of your floors are uneven, fix them.  If there are any pets around you, be aware of where they are.  Review your medicines with your doctor. Some medicines can make you feel dizzy. This can increase your chance of falling. Ask your doctor what other things that you can do to help prevent falls. This information is not intended to replace advice given to you by your health care provider. Make sure you discuss any questions you have with your health care provider. Document Released: 05/30/2009 Document Revised: 01/09/2016 Document Reviewed: 09/07/2014 Elsevier Interactive Patient Education  2017 [employer].

## 2019-07-27 ENCOUNTER — Ambulatory Visit: Payer: No Typology Code available for payment source | Admitting: Psychiatry

## 2019-08-28 ENCOUNTER — Other Ambulatory Visit: Payer: Self-pay

## 2019-08-28 ENCOUNTER — Ambulatory Visit (INDEPENDENT_AMBULATORY_CARE_PROVIDER_SITE_OTHER): Payer: Medicare Other | Admitting: Psychiatry

## 2019-08-28 ENCOUNTER — Encounter: Payer: Self-pay | Admitting: Psychiatry

## 2019-08-28 DIAGNOSIS — F419 Anxiety disorder, unspecified: Secondary | ICD-10-CM | POA: Diagnosis not present

## 2019-08-28 DIAGNOSIS — F3176 Bipolar disorder, in full remission, most recent episode depressed: Secondary | ICD-10-CM | POA: Diagnosis not present

## 2019-08-28 MED ORDER — OXCARBAZEPINE 300 MG PO TABS: 300.0000 mg | ORAL_TABLET | Freq: Two times a day (BID) | ORAL | 1 refills | Status: DC

## 2019-08-28 MED ORDER — TOPIRAMATE 50 MG PO TABS: 50.0000 mg | ORAL_TABLET | Freq: Every day | ORAL | 1 refills | Status: DC

## 2019-08-28 MED ORDER — LITHIUM CARBONATE ER 450 MG PO TBCR: 450.0000 mg | EXTENDED_RELEASE_TABLET | Freq: Two times a day (BID) | ORAL | 1 refills | Status: DC

## 2019-08-28 MED ORDER — CLONAZEPAM 2 MG PO TABS: 2.0000 mg | ORAL_TABLET | Freq: Three times a day (TID) | ORAL | 1 refills | Status: DC | PRN

## 2019-08-28 NOTE — Progress Notes (Signed)
Psychiatric Initial Adult Assessment   I connected with  Frederick [name] on 08/28/19 by a video enabled telemedicine application and verified that I am speaking with the correct person using two identifiers.   I discussed the limitations of evaluation and management by telemedicine. The patient expressed understanding and agreed to proceed.    Patient Identification: Frederick [name] MRN:  [id number] Date of Evaluation:  08/28/2019   Referral Source: PCP  Chief Complaint:   " I am doing okay."  Visit Diagnosis:    ICD-10-CM   1. Bipolar 1 disorder, depressed, full remission ([place])  F31.76   2. Anxiety  F41.9     History of Present Illness: This is a 44 year old male with long history of bipolar disorder.  He reported that he has been tried on numerous different medications.  He was under the care of Dr. Myer Haff at [facility] behavioral care in [place] for about 12 years.  He had to switch his psychiatrist due to insurance changes. He reported that he has been on lithium carbonate CR 450 mg BID, Trileptal 300 mg BID, Topamax 50 mg daily and Clonazepam 2 mg BID 2-3 times daily for past multiple years. He reported that when trileptal was added to his regimen of Lithium he noted a significant improvement. He has been out of work since October 2004. He is on disability now. He resides with his wife and 2 children.  He has hx of prior suicidal ideations but denied any current plans or intent.  Pt reported that his current regimen is helpful and he is doing fine. However, he still continues to deal with insomnia. He stated that he does not go to bed until 3 am in the morning as he is unable to fall asleep at night. He stays in bed until around 11 in the morning.  He has tried and failed numerous sleeping agents- Trazodone, Doxepin, Temazepam, Belsomra, lunesta, Saphris (nightmares), Ambien.  Other than insomnia, he has no issues or concerns at this time.  I have reviewed the PDMP during  this encounter. It was noted that he was being given 120 tabs of clonazepam per prescription. He has been filling the prescription close to 3 monthly intervals. Pt stated that he usually takes 2-3 tabs of clonazepam daily. Very rarely like 1-2 times per month he may take 4 tabs.  He denied any active depressive or manic or hypomanic symptoms. He reported having lot of anxiety and panic like symptoms 2-3 times per month. He denied any hx of PTSD, denied flashbacks or nightmares. He denied any hallucinations or delusions. He denied illicit of substances or excessive consumption of alcohol.  He used to see a therapist in the past, has not been seeing one lately.  Associated Signs/Symptoms: Depression Symptoms:  denied (Hypo) Manic Symptoms:  denied Anxiety Symptoms:  Excessive Worry, Panic Symptoms, Psychotic Symptoms:  denied PTSD Symptoms: Negative  Past Psychiatric History:   Previous Psychotropic Medications: Yes   Substance Abuse History in the last 12 months:  No.  Consequences of Substance Abuse: Negative  Past Medical History:  Past Medical History:  Diagnosis Date  . Allergy   . Anxiety   . Arthritis   . Bipolar disorder ([place])   . GERD (gastroesophageal reflux disease)     Past Surgical History:  Procedure Laterality Date  . PILONIDAL CYST EXCISION      Family Psychiatric History: see below  Family History:  Family History  Problem Relation Age of Onset  . Bipolar disorder Mother   .  Arthritis Father   . Bipolar disorder Brother   . Bipolar disorder Maternal Uncle   . Bipolar disorder Paternal Uncle   . Cancer Paternal Grandmother        lymphoma?    Social History:   Social History   Socioeconomic History  . Marital status: Married    Spouse name: Not on file  . Number of children: 2  . Years of education: Not on file  . Highest education level: Associate degree: occupational, Scientist, product/process development, or vocational program  Occupational History  . Occupation:  disability  Tobacco Use  . Smoking status: Current Every Day Smoker    Packs/day: 0.25    Years: 10.00    Pack years: 2.50    Types: Cigarettes  . Smokeless tobacco: Never Used  . Tobacco comment: smokes about 2-3 a day  Substance and Sexual Activity  . Alcohol use: Not Currently    Alcohol/week: 0.0 standard drinks  . Drug use: No  . Sexual activity: Yes    Partners: Female  Other Topics Concern  . Not on file  Social History Narrative   Married.   2 children.   Disabled.   Enjoys playing disc golf.    Social Determinants of Health   Financial Resource Strain:   . Difficulty of Paying Living Expenses: Not on file  Food Insecurity:   . Worried About Programme researcher, broadcasting/film/video in the Last Year: Not on file  . Ran Out of Food in the Last Year: Not on file  Transportation Needs:   . Lack of Transportation (Medical): Not on file  . Lack of Transportation (Non-Medical): Not on file  Physical Activity: Sufficiently Active  . Days of Exercise per Week: 7 days  . Minutes of Exercise per Session: 60 min  Stress: No Stress Concern Present  . Feeling of Stress : Not at all  Social Connections:   . Frequency of Communication with Friends and Family: Not on file  . Frequency of Social Gatherings with Friends and Family: Not on file  . Attends Religious Services: Not on file  . Active Member of Clubs or Organizations: Not on file  . Attends Banker Meetings: Not on file  . Marital Status: Not on file    Additional Social History: Lives with wife and 2 children, on disability  Allergies:   Allergies  Allergen Reactions  . Alprazolam Itching and Nausea And Vomiting  . Bee Venom   . Clindamycin Hives  . Ziprasidone Hcl Itching    Metabolic Disorder Labs: Lab Results  Component Value Date   HGBA1C 5.4 06/08/2019   No results found for: PROLACTIN Lab Results  Component Value Date   CHOL 204 (H) 06/08/2019   TRIG 180 (H) 06/08/2019   HDL 36 (L) 06/08/2019    CHOLHDL 5.7 (H) 06/08/2019   VLDL 19.0 02/05/2017   LDLCALC 136 (H) 06/08/2019   LDLCALC 133 (H) 04/04/2018   Lab Results  Component Value Date   TSH 1.790 06/08/2019    Therapeutic Level Labs: Lab Results  Component Value Date   LITHIUM 0.6 01/28/2016   No results found for: CBMZ No results found for: VALPROATE  Current Medications: Current Outpatient Medications  Medication Sig Dispense Refill  . Cholecalciferol (VITAMIN D3 MAXIMUM STRENGTH) 125 MCG (5000 UT) capsule Take 5,000 Units by mouth daily.    . famotidine (PEPCID) 40 MG tablet Take 40 mg by mouth daily. As needed    . pantoprazole (PROTONIX) 40 MG tablet Take 1  tablet (40 mg total) by mouth daily. (Patient not taking: Reported on 06/20/2019) 90 tablet 1  . topiramate (TOPAMAX) 50 MG tablet Take 50 mg by mouth every morning.     No current facility-administered medications for this visit.    Musculoskeletal: Strength & Muscle Tone: unable to assess due to telemed visit Gait & Station: unable to assess due to telemed visit Patient leans: unable to assess due to telemed visit  Psychiatric Specialty Exam: Review of Systems  There were no vitals taken for this visit.There is no height or weight on file to calculate BMI.  General Appearance: Fairly Groomed  Eye Contact:  Good  Speech:  Clear and Coherent and Normal Rate  Volume:  Normal  Mood:  Euthymic  Affect:  Congruent  Thought Process:  Goal Directed, Linear and Descriptions of Associations: Intact  Orientation:  Full (Time, Place, and Person)  Thought Content:  Logical  Suicidal Thoughts:  No  Homicidal Thoughts:  No  Memory:  Recent;   Good Remote;   Good  Judgement:  Fair  Insight:  Fair  Psychomotor Activity:  Normal  Concentration:  Concentration: Good and Attention Span: Good  Recall:  Good  Fund of Knowledge:Good  Language: Good  Akathisia:  Negative  Handed:  Right  AIMS (if indicated):  Not done  Assets:  Communication Skills Desire for  Improvement Financial Resources/Insurance Housing Social Support  ADL's:  Intact  Cognition: WNL  Sleep:  Poor   Screenings: Mini-Mental     Clinical Support from 02/05/2017 in [facility]  Total Score (max 30 points )  20    PHQ2-9     Clinical Support from 06/20/2019 in [facility] Office Visit from 09/07/2018 in [facility] Clinical Support from 05/26/2018 in [facility] Office Visit from 04/04/2018 in [facility] Clinical Support from 02/05/2017 in [facility] Total Score  0  2  1  1  1   PHQ-9 Total Score  --  11  9  6   --      Assessment and Plan: 44 year old male with longstanding history of bipolar disorder now seen for transfer of care.  Patient has been stable on his current medication regimen and would like to continue the same.  He does report difficulty falling asleep has failed numerous sedative-hypnotic medications. Patient was given sleep hygiene measures.  1. Bipolar 1 disorder, depressed, full remission ([facility])  - Continue lithium carbonate (ESKALITH) 450 MG CR tablet; Take 1 tablet (450 mg total) by mouth 2 (two) times daily.  Dispense: 60 tablet; Refill: 1 - Continue topiramate (TOPAMAX) 50 MG tablet; Take 1 tablet (50 mg total) by mouth daily.  Dispense: 30 tablet; Refill: 1 - Continue Oxcarbazepine (TRILEPTAL) 300 MG tablet; Take 1 tablet (300 mg total) by mouth 2 (two) times daily.  Dispense: 60 tablet; Refill: 1 - Reduce clonazePAM (KLONOPIN) 2 MG tablet; Take 1 tablet (2 mg total) by mouth 3 (three) times daily as needed for anxiety.  Dispense: 90 tablet; Refill: 1  2. Anxiety  - reduce clonazePAM (KLONOPIN) 2 MG tablet; Take 1 tablet (2 mg total) by mouth 3 (three) times daily as needed for anxiety.  Dispense: 90 tablet; Refill: 1  Will continue the same regimen. Since pt does not need 120 pills of clonazepam will prescribed 90 pills only. F/up in 2  months.  , MD 1/11/20212:33 PM

## 2019-10-07 ENCOUNTER — Ambulatory Visit: Payer: No Typology Code available for payment source | Attending: Internal Medicine

## 2019-10-07 DIAGNOSIS — Z23 Encounter for immunization: Secondary | ICD-10-CM | POA: Insufficient documentation

## 2019-10-07 NOTE — Progress Notes (Signed)
   Covid-19 Vaccination Clinic  Name:  Frederick [name]    MRN: [id number] DOB: 1976/05/11  10/07/2019  Mr. Frederick [name] was observed post Covid-19 immunization for 15 minutes without incidence.Patient was called because he forgot to check out. Call was made to cell phone to verify.  He was provided with Vaccine Information Sheet and instruction to access the V-Safe system.   Mr. Frederick [name] was instructed to call 911 with any severe reactions post vaccine: Marland Kitchen Difficulty breathing  . Swelling of your face and throat  . A fast heartbeat  . A bad rash all over your body  . Dizziness and weakness    Immunizations Administered    Name Date Dose VIS Date Route   Pfizer COVID-19 Vaccine 10/07/2019  4:10 PM 0.3 mL 07/28/2019 Intramuscular   Manufacturer: [employer], [employer]   Lot: [id number]   NDC: [address]-6

## 2019-10-24 ENCOUNTER — Ambulatory Visit (INDEPENDENT_AMBULATORY_CARE_PROVIDER_SITE_OTHER): Payer: No Typology Code available for payment source | Admitting: Psychiatry

## 2019-10-24 ENCOUNTER — Encounter: Payer: Self-pay | Admitting: Psychiatry

## 2019-10-24 ENCOUNTER — Other Ambulatory Visit: Payer: Self-pay

## 2019-10-24 DIAGNOSIS — F3176 Bipolar disorder, in full remission, most recent episode depressed: Secondary | ICD-10-CM

## 2019-10-24 DIAGNOSIS — F419 Anxiety disorder, unspecified: Secondary | ICD-10-CM | POA: Diagnosis not present

## 2019-10-24 MED ORDER — CLONAZEPAM 2 MG PO TABS: 2.0000 mg | ORAL_TABLET | Freq: Three times a day (TID) | ORAL | 2 refills | Status: DC | PRN

## 2019-10-24 MED ORDER — OXCARBAZEPINE 300 MG PO TABS: 300.0000 mg | ORAL_TABLET | Freq: Two times a day (BID) | ORAL | 2 refills | Status: DC

## 2019-10-24 MED ORDER — LITHIUM CARBONATE ER 450 MG PO TBCR: 450.0000 mg | EXTENDED_RELEASE_TABLET | Freq: Two times a day (BID) | ORAL | 2 refills | Status: DC

## 2019-10-24 MED ORDER — TOPIRAMATE 50 MG PO TABS: 50.0000 mg | ORAL_TABLET | Freq: Every day | ORAL | 2 refills | Status: DC

## 2019-10-24 NOTE — Progress Notes (Signed)
BH MD OP Progress Note  I connected with  Frederick [name] on 10/24/19 by a video enabled telemedicine application and verified that I am speaking with the correct person using two identifiers.   I discussed the limitations of evaluation and management by telemedicine. The patient expressed understanding and agreed to proceed.    10/24/2019 3:04 PM Frederick [name]  MRN:  [id number]  Chief Complaint: " I am doing okay."  HPI: Patient reported that he has been doing well.  He stated that he is able to take care of his daily routine and chores.  He informed that his sleep is still not as good as it should be.  He stated that lately he has been sleeping around 9:30 PM and then waking up around 1:30 AM.  He would take care of his morning activities and then take a nap during the daytime and this way he is able to manage for the most part.  He denied any significant issues or concerns pertaining to his medications.  Visit Diagnosis:    ICD-10-CM   1. Bipolar 1 disorder, depressed, full remission (HCC)  F31.76   2. Anxiety  F41.9     Past Psychiatric History: Bipolar disorder, anxiety  Past Medical History:  Past Medical History:  Diagnosis Date  . Allergy   . Anxiety   . Arthritis   . Bipolar disorder (HCC)   . GERD (gastroesophageal reflux disease)     Past Surgical History:  Procedure Laterality Date  . PILONIDAL CYST EXCISION      Family Psychiatric History: see below  Family History:  Family History  Problem Relation Age of Onset  . Bipolar disorder Mother   . Arthritis Father   . Bipolar disorder Brother   . Bipolar disorder Maternal Uncle   . Bipolar disorder Paternal Uncle   . Cancer Paternal Grandmother        lymphoma?    Social History:  Social History   Socioeconomic History  . Marital status: Married    Spouse name: Not on file  . Number of children: 2  . Years of education: Not on file  . Highest education level: Associate degree: occupational,  Scientist, product/process development, or vocational program  Occupational History  . Occupation: disability  Tobacco Use  . Smoking status: Current Every Day Smoker    Packs/day: 0.25    Years: 10.00    Pack years: 2.50    Types: Cigarettes  . Smokeless tobacco: Never Used  . Tobacco comment: smokes about 2-3 a day  Substance and Sexual Activity  . Alcohol use: Not Currently    Alcohol/week: 0.0 standard drinks  . Drug use: No  . Sexual activity: Yes    Partners: Female  Other Topics Concern  . Not on file  Social History Narrative   Married.   2 children.   Disabled.   Enjoys playing disc golf.    Social Determinants of Health   Financial Resource Strain:   . Difficulty of Paying Living Expenses: Not on file  Food Insecurity:   . Worried About Programme researcher, broadcasting/film/video in the Last Year: Not on file  . Ran Out of Food in the Last Year: Not on file  Transportation Needs:   . Lack of Transportation (Medical): Not on file  . Lack of Transportation (Non-Medical): Not on file  Physical Activity: Sufficiently Active  . Days of Exercise per Week: 7 days  . Minutes of Exercise per Session: 60 min  Stress: No Stress Concern Present  .  Feeling of Stress : Not at all  Social Connections:   . Frequency of Communication with Friends and Family: Not on file  . Frequency of Social Gatherings with Friends and Family: Not on file  . Attends Religious Services: Not on file  . Active Member of Clubs or Organizations: Not on file  . Attends Archivist Meetings: Not on file  . Marital Status: Not on file    Allergies:  Allergies  Allergen Reactions  . Alprazolam Itching and Nausea And Vomiting  . Bee Venom   . Clindamycin Hives  . Ziprasidone Hcl Itching    Metabolic Disorder Labs: Lab Results  Component Value Date   HGBA1C 5.4 06/08/2019   No results found for: PROLACTIN Lab Results  Component Value Date   CHOL 204 (H) 06/08/2019   TRIG 180 (H) 06/08/2019   HDL 36 (L) 06/08/2019   CHOLHDL  5.7 (H) 06/08/2019   VLDL 19.0 02/05/2017   LDLCALC 136 (H) 06/08/2019   LDLCALC 133 (H) 04/04/2018   Lab Results  Component Value Date   TSH 1.790 06/08/2019   TSH 2.360 04/04/2018    Therapeutic Level Labs: Lab Results  Component Value Date   LITHIUM 0.6 01/28/2016   LITHIUM 0.30 (L) 10/28/2015   No results found for: VALPROATE No components found for:  CBMZ  Current Medications: Current Outpatient Medications  Medication Sig Dispense Refill  . Cholecalciferol (VITAMIN D3 MAXIMUM STRENGTH) 125 MCG (5000 UT) capsule Take 5,000 Units by mouth daily.    . clonazePAM (KLONOPIN) 2 MG tablet Take 1 tablet (2 mg total) by mouth 3 (three) times daily as needed for anxiety. 90 tablet 1  . famotidine (PEPCID) 40 MG tablet Take 40 mg by mouth daily. As needed    . lithium carbonate (ESKALITH) 450 MG CR tablet Take 1 tablet (450 mg total) by mouth 2 (two) times daily. 60 tablet 1  . Oxcarbazepine (TRILEPTAL) 300 MG tablet Take 1 tablet (300 mg total) by mouth 2 (two) times daily. 60 tablet 1  . pantoprazole (PROTONIX) 40 MG tablet Take 1 tablet (40 mg total) by mouth daily. (Patient not taking: Reported on 06/20/2019) 90 tablet 1  . topiramate (TOPAMAX) 50 MG tablet Take 50 mg by mouth every morning.    . topiramate (TOPAMAX) 50 MG tablet Take 1 tablet (50 mg total) by mouth daily. 30 tablet 1   No current facility-administered medications for this visit.     Musculoskeletal: Strength & Muscle Tone: unable to assess due to telemed visit Gait & Station: unable to assess due to telemed visit Patient leans: unable to assess due to telemed visit  Psychiatric Specialty Exam: Review of Systems  There were no vitals taken for this visit.There is no height or weight on file to calculate BMI.  General Appearance: Fairly Groomed  Eye Contact:  Good  Speech:  Clear and Coherent and Normal Rate  Volume:  Normal  Mood:  Euthymic  Affect:  Congruent  Thought Process:  Goal Directed, Linear  and Descriptions of Associations: Intact  Orientation:  Full (Time, Place, and Person)  Thought Content:  Logical  Suicidal Thoughts:  No  Homicidal Thoughts:  No  Memory:  Recent;   Good Remote;   Good  Judgement:  Fair  Insight:  Fair  Psychomotor Activity:  Normal  Concentration:  Concentration: Good and Attention Span: Good  Recall:  Good  Fund of Knowledge:Good  Language: Good  Akathisia:  Negative  Handed:  Right  AIMS (  if indicated):  Not done  Assets:  Communication Skills Desire for Improvement Financial Resources/Insurance Housing Social Support  ADL's:  Intact  Cognition: WNL  Sleep:  Fair      Screenings: Mini-Mental     Clinical Support from 02/05/2017 in [facility]  Total Score (max 30 points )  20    PHQ2-9     Clinical Support from 06/20/2019 in [facility] Office Visit from 09/07/2018 in [facility] Clinical Support from 05/26/2018 in [facility] Office Visit from 04/04/2018 in [facility] Clinical Support from 02/05/2017 in [facility] Total Score  0  2  1  1  1   PHQ-9 Total Score  -  11  9  6   -       Assessment and Plan: 44 year old male with history of bipolar disorder, anxiety now seen for follow-up.  Patient appears to be stable on his current regimen.  1. Bipolar 1 disorder, depressed, full remission (HCC)  - Oxcarbazepine (TRILEPTAL) 300 MG tablet; Take 1 tablet (300 mg total) by mouth 2 (two) times daily.  Dispense: 60 tablet; Refill: 2 - clonazePAM (KLONOPIN) 2 MG tablet; Take 1 tablet (2 mg total) by mouth 3 (three) times daily as needed for anxiety.  Dispense: 90 tablet; Refill: 2 - topiramate (TOPAMAX) 50 MG tablet; Take 1 tablet (50 mg total) by mouth daily.  Dispense: 30 tablet; Refill: 2 - lithium carbonate (ESKALITH) 450 MG CR tablet; Take 1 tablet (450 mg total) by mouth 2 (two) times daily.  Dispense: 60 tablet; Refill:  2  2. Anxiety  - clonazePAM (KLONOPIN) 2 MG tablet; Take 1 tablet (2 mg total) by mouth 3 (three) times daily as needed for anxiety.  Dispense: 90 tablet; Refill: 2  Continue same medication regimen. Follow up in 3 months.   , MD 10/24/2019, 3:04 PM

## 2019-11-01 ENCOUNTER — Ambulatory Visit: Payer: No Typology Code available for payment source | Attending: Internal Medicine

## 2019-11-01 DIAGNOSIS — Z23 Encounter for immunization: Secondary | ICD-10-CM

## 2019-11-01 NOTE — Progress Notes (Signed)
   Covid-19 Vaccination Clinic  Name:  Frederick [name]    MRN: [id number] DOB: August 10, 1976  11/01/2019  [name] was observed post Covid-19 immunization for 15 minutes without incident. He was provided with Vaccine Information Sheet and instruction to access the V-Safe system.   [name] was instructed to call 911 with any severe reactions post vaccine: Marland Kitchen Difficulty breathing  . Swelling of face and throat  . A fast heartbeat  . A bad rash all over body  . Dizziness and weakness   Immunizations Administered    Name Date Dose VIS Date Route   Pfizer COVID-19 Vaccine 11/01/2019 11:40 AM 0.3 mL 07/28/2019 Intramuscular   Manufacturer: [employer], [employer]   Lot: [id number]   NDC: [address]-8

## 2019-12-01 NOTE — Progress Notes (Deleted)
    Established patient visit  {the wildcard for the provider attestation at the bottom of the note has been replaced with a short label [provider attestation**:1] to remind the CMA NOT to document over the wildcard. You can F2 to the label then type directly over it with the provider attestation dot phrase:1}   Patient: Frederick [name]   DOB: 1976-01-02   44 y.o. Male  MRN: [id number] Visit Date: 12/01/2019  Today's healthcare provider: Shirlee Latch, MD  Subjective:   No chief complaint on file.  HPI *** -HIV screening  {Show patient history (optional):23778::" "}   Medications: Outpatient Medications Prior to Visit  Medication Sig  . Cholecalciferol (VITAMIN D3 MAXIMUM STRENGTH) 125 MCG (5000 UT) capsule Take 5,000 Units by mouth daily.  . clonazePAM (KLONOPIN) 2 MG tablet Take 1 tablet (2 mg total) by mouth 3 (three) times daily as needed for anxiety.  . famotidine (PEPCID) 40 MG tablet Take 40 mg by mouth daily. As needed  . lithium carbonate (ESKALITH) 450 MG CR tablet Take 1 tablet (450 mg total) by mouth 2 (two) times daily.  . Oxcarbazepine (TRILEPTAL) 300 MG tablet Take 1 tablet (300 mg total) by mouth 2 (two) times daily.  . pantoprazole (PROTONIX) 40 MG tablet Take 1 tablet (40 mg total) by mouth daily. (Patient not taking: Reported on 06/20/2019)  . topiramate (TOPAMAX) 50 MG tablet Take 50 mg by mouth every morning.  . topiramate (TOPAMAX) 50 MG tablet Take 1 tablet (50 mg total) by mouth daily.   No facility-administered medications prior to visit.    Review of Systems  {Show previous labs (optional):23779::" "}    Objective:    There were no vitals taken for this visit. {Show previous vital signs (optional):23777::" "}  Physical Exam  ***  No results found for any visits on 12/04/19.    Assessment & Plan:    ***  No follow-ups on file.     {provider attestation***:1}   Shirlee Latch, MD  [facility] [phone]  (phone) [phone] (fax)  [facility]

## 2019-12-01 NOTE — Progress Notes (Deleted)
Complete physical exam    Patient: Frederick [name]   DOB: 1976/05/12   44 y.o. Male  MRN: [id number] Visit Date: 12/01/2019  Today's healthcare provider: Lavon Paganini, MD  Subjective:   No chief complaint on file.   Frederick [name] is a 44 y.o. male who presents today for a complete physical exam.  He reports consuming a {diet types:17450} diet. {Exercise:19826} He generally feels {well/fairly well/poorly:18703}. He reports sleeping {well/fairly well/poorly:18703}. He {does/does not:200015} have additional problems to discuss today.  HPI  ***  Past Medical History:  Diagnosis Date  . Allergy   . Anxiety   . Arthritis   . Bipolar disorder ([place])   . GERD (gastroesophageal reflux disease)    Past Surgical History:  Procedure Laterality Date  . PILONIDAL CYST EXCISION     Social History   Socioeconomic History  . Marital status: Married    Spouse name: Not on file  . Number of children: 2  . Years of education: Not on file  . Highest education level: Associate degree: occupational, Hotel manager, or vocational program  Occupational History  . Occupation: disability  Tobacco Use  . Smoking status: Current Every Day Smoker    Packs/day: 0.25    Years: 10.00    Pack years: 2.50    Types: Cigarettes  . Smokeless tobacco: Never Used  . Tobacco comment: smokes about 2-3 a day  Substance and Sexual Activity  . Alcohol use: Not Currently    Alcohol/week: 0.0 standard drinks  . Drug use: No  . Sexual activity: Yes    Partners: Female  Other Topics Concern  . Not on file  Social History Narrative   Married.   2 children.   Disabled.   Enjoys playing disc golf.    Social Determinants of Health   Financial Resource Strain:   . Difficulty of Paying Living Expenses:   Food Insecurity:   . Worried About Charity fundraiser in the Last Year:   . Arboriculturist in the Last Year:   Transportation Needs:   . Film/video editor (Medical):   Marland Kitchen Lack of  Transportation (Non-Medical):   Physical Activity: Sufficiently Active  . Days of Exercise per Week: 7 days  . Minutes of Exercise per Session: 60 min  Stress: No Stress Concern Present  . Feeling of Stress : Not at all  Social Connections:   . Frequency of Communication with Friends and Family:   . Frequency of Social Gatherings with Friends and Family:   . Attends Religious Services:   . Active Member of Clubs or Organizations:   . Attends Archivist Meetings:   Marland Kitchen Marital Status:   Intimate Partner Violence:   . Fear of Current or Ex-Partner:   . Emotionally Abused:   Marland Kitchen Physically Abused:   . Sexually Abused:    Family Status  Relation Name Status  . Mother  Alive  . Father  Alive  . Brother  Alive  . Mat Uncle  (Not Specified)  . Annamarie Major  (Not Specified)  . MGM  Deceased  . MGF  Deceased  . PGM  Deceased  . PGF  Deceased   Family History  Problem Relation Age of Onset  . Bipolar disorder Mother   . Arthritis Father   . Bipolar disorder Brother   . Bipolar disorder Maternal Uncle   . Bipolar disorder Paternal Uncle   . Cancer Paternal Grandmother        lymphoma?  Allergies  Allergen Reactions  . Alprazolam Itching and Nausea And Vomiting  . Bee Venom   . Clindamycin Hives  . Ziprasidone Hcl Itching    Patient Care Team: Erasmo Downer, MD as PCP - General (Family Medicine) Zena Amos, MD as Consulting Physician (Psychiatry)   Medications: Outpatient Medications Prior to Visit  Medication Sig  . Cholecalciferol (VITAMIN D3 MAXIMUM STRENGTH) 125 MCG (5000 UT) capsule Take 5,000 Units by mouth daily.  . clonazePAM (KLONOPIN) 2 MG tablet Take 1 tablet (2 mg total) by mouth 3 (three) times daily as needed for anxiety.  . famotidine (PEPCID) 40 MG tablet Take 40 mg by mouth daily. As needed  . lithium carbonate (ESKALITH) 450 MG CR tablet Take 1 tablet (450 mg total) by mouth 2 (two) times daily.  . Oxcarbazepine (TRILEPTAL) 300 MG tablet  Take 1 tablet (300 mg total) by mouth 2 (two) times daily.  . pantoprazole (PROTONIX) 40 MG tablet Take 1 tablet (40 mg total) by mouth daily. (Patient not taking: Reported on 06/20/2019)  . topiramate (TOPAMAX) 50 MG tablet Take 50 mg by mouth every morning.  . topiramate (TOPAMAX) 50 MG tablet Take 1 tablet (50 mg total) by mouth daily.   No facility-administered medications prior to visit.    Review of Systems  {Show previous labs (optional):23779::" "}    Objective:    There were no vitals taken for this visit. {Show previous vital signs (optional):23777::" "}  Physical Exam  ***  Depression Screen  PHQ 2/9 Scores 06/20/2019 09/07/2018 05/26/2018  PHQ - 2 Score 0 2 1  PHQ- 9 Score - 11 9    No results found for any visits on 12/04/19.    Assessment & Plan:    Routine Health Maintenance and Physical Exam  Exercise Activities and Dietary recommendations Goals    . DIET - DECREASE SODA OR JUICE INTAKE     Recommend to cut back to no more than 1 soda a day and increase water intake.     . Have 3 meals a day     Recommend to decrease portion sizes by eating 3 small healthy meals and at least 2 healthy snacks per day.         . weight management     Target weight is 200lbs. Starting 02/05/17, I will continue to play disc golf for at least 2 hours or more as schedule permits.        Immunization History  Administered Date(s) Administered  . Influenza,inj,Quad PF,6+ Mos 04/29/2016, 05/26/2018, 06/08/2019  . Influenza-Unspecified 04/29/2016  . PFIZER SARS-COV-2 Vaccination 10/07/2019, 11/01/2019    Health Maintenance  Topic Date Due  . TETANUS/TDAP  02/06/2027 (Originally 01/23/1993)  . HIV Screening  02/05/2099 (Originally 01/23/1989)  . INFLUENZA VACCINE  03/17/2020    Discussed health benefits of physical activity, and encouraged him to engage in regular exercise appropriate for his age and condition.  ***  No follow-ups on file.     {provider  attestation***:1}   Shirlee Latch, MD  [facility] [phone] (phone) [phone] (fax)  [facility]

## 2019-12-04 ENCOUNTER — Encounter: Payer: No Typology Code available for payment source | Admitting: Family Medicine

## 2019-12-22 ENCOUNTER — Encounter: Payer: No Typology Code available for payment source | Admitting: Family Medicine

## 2020-01-16 ENCOUNTER — Telehealth: Payer: No Typology Code available for payment source | Admitting: Psychiatry

## 2020-02-14 ENCOUNTER — Other Ambulatory Visit: Payer: Self-pay | Admitting: Psychiatry

## 2020-02-14 ENCOUNTER — Telehealth: Payer: Self-pay

## 2020-02-14 DIAGNOSIS — F3176 Bipolar disorder, in full remission, most recent episode depressed: Secondary | ICD-10-CM

## 2020-02-14 DIAGNOSIS — F419 Anxiety disorder, unspecified: Secondary | ICD-10-CM

## 2020-02-14 MED ORDER — CLONAZEPAM 2 MG PO TABS: 2.0000 mg | ORAL_TABLET | Freq: Two times a day (BID) | ORAL | 1 refills | Status: AC | PRN

## 2020-02-14 MED ORDER — LITHIUM CARBONATE ER 450 MG PO TBCR: 450.0000 mg | EXTENDED_RELEASE_TABLET | Freq: Two times a day (BID) | ORAL | 1 refills | Status: DC

## 2020-02-14 MED ORDER — OXCARBAZEPINE 300 MG PO TABS: 300.0000 mg | ORAL_TABLET | Freq: Two times a day (BID) | ORAL | 1 refills | Status: DC

## 2020-02-14 MED ORDER — TOPIRAMATE 50 MG PO TABS: 50.0000 mg | ORAL_TABLET | Freq: Every day | ORAL | 1 refills | Status: DC

## 2020-02-14 NOTE — Telephone Encounter (Signed)
30 day prescription with 1 refill sent to pharmacy. Seeing Dr. Elna Breslow on 03/18/20.

## 2020-02-14 NOTE — Telephone Encounter (Signed)
pt needs refills to get to his appt in aug with dr. Elna Breslow . dr. Mariam Dollar will not refill since she had not seen patient yet.

## 2020-03-18 ENCOUNTER — Other Ambulatory Visit: Payer: Self-pay | Admitting: Psychiatry

## 2020-03-18 ENCOUNTER — Telehealth (INDEPENDENT_AMBULATORY_CARE_PROVIDER_SITE_OTHER): Payer: No Typology Code available for payment source | Admitting: Psychiatry

## 2020-03-18 ENCOUNTER — Encounter: Payer: Self-pay | Admitting: Psychiatry

## 2020-03-18 ENCOUNTER — Telehealth: Payer: Self-pay

## 2020-03-18 ENCOUNTER — Other Ambulatory Visit: Payer: Self-pay

## 2020-03-18 DIAGNOSIS — Z79899 Other long term (current) drug therapy: Secondary | ICD-10-CM | POA: Diagnosis not present

## 2020-03-18 DIAGNOSIS — G4700 Insomnia, unspecified: Secondary | ICD-10-CM | POA: Diagnosis not present

## 2020-03-18 DIAGNOSIS — E559 Vitamin D deficiency, unspecified: Secondary | ICD-10-CM | POA: Insufficient documentation

## 2020-03-18 DIAGNOSIS — F410 Panic disorder [episodic paroxysmal anxiety] without agoraphobia: Secondary | ICD-10-CM | POA: Diagnosis not present

## 2020-03-18 DIAGNOSIS — F3176 Bipolar disorder, in full remission, most recent episode depressed: Secondary | ICD-10-CM | POA: Diagnosis not present

## 2020-03-18 MED ORDER — LITHIUM CARBONATE ER 450 MG PO TBCR: 450.0000 mg | EXTENDED_RELEASE_TABLET | Freq: Two times a day (BID) | ORAL | 1 refills | Status: AC

## 2020-03-18 MED ORDER — MIRTAZAPINE 7.5 MG PO TABS: 7.5000 mg | ORAL_TABLET | Freq: Every day | ORAL | 1 refills | Status: DC

## 2020-03-18 MED ORDER — TOPIRAMATE 50 MG PO TABS: 50.0000 mg | ORAL_TABLET | Freq: Every day | ORAL | 1 refills | Status: AC

## 2020-03-18 MED ORDER — OXCARBAZEPINE 300 MG PO TABS: 300.0000 mg | ORAL_TABLET | Freq: Two times a day (BID) | ORAL | 1 refills | Status: DC

## 2020-03-18 NOTE — Telephone Encounter (Signed)
faxed and confirmed labwork orders  
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
## 2020-03-18 NOTE — Patient Instructions (Signed)
Clonazepam tablets What is this medicine? CLONAZEPAM (kloe NA ze pam) is a benzodiazepine. It is used to treat certain types of seizures. It is also used to treat panic disorder. This medicine may be used for other purposes; ask your health care provider or pharmacist if you have questions. COMMON BRAND NAME(S): Ceberclon, Klonopin What should I tell my health care provider before I take this medicine? They need to know if you have any of these conditions:  an alcohol or drug abuse problem  bipolar disorder, depression, psychosis or other mental health condition  glaucoma  kidney or liver disease  lung or breathing disease  myasthenia gravis  Parkinson's disease  porphyria  seizures or a history of seizures  suicidal thoughts  an unusual or allergic reaction to clonazepam, other benzodiazepines, foods, dyes, or preservatives  pregnant or trying to get pregnant  breast-feeding How should I use this medicine? Take this medicine by mouth with a glass of water. Follow the directions on the prescription label. If it upsets your stomach, take it with food or milk. Take your medicine at regular intervals. Do not take it more often than directed. Do not stop taking or change the dose except on the advice of your doctor or health care professional. A special MedGuide will be given to you by the pharmacist with each prescription and refill. Be sure to read this information carefully each time. Talk to your pediatrician regarding the use of this medicine in children. Special care may be needed. Overdosage: If you think you have taken too much of this medicine contact a poison control center or emergency room at once. NOTE: This medicine is only for you. Do not share this medicine with others. What if I miss a dose? If you miss a dose, take it as soon as you can. If it is almost time for your next dose, take only that dose. Do not take double or extra doses. What may interact with this  medicine? Do not take this medication with any of the following medicines:  narcotic medicines for cough  sodium oxybate This medicine may also interact with the following medications:  alcohol  antihistamines for allergy, cough and cold  antiviral medicines for HIV or AIDS  certain medicines for anxiety or sleep  certain medicines for depression, like amitriptyline, fluoxetine, sertraline  certain medicines for fungal infections like ketoconazole and itraconazole  certain medicines for seizures like carbamazepine, phenobarbital, phenytoin, primidone  general anesthetics like halothane, isoflurane, methoxyflurane, propofol  local anesthetics like lidocaine, pramoxine, tetracaine  medicines that relax muscles for surgery  narcotic medicines for pain  phenothiazines like chlorpromazine, mesoridazine, prochlorperazine, thioridazine This list may not describe all possible interactions. Give your health care provider a list of all the medicines, herbs, non-prescription drugs, or dietary supplements you use. Also tell them if you smoke, drink alcohol, or use illegal drugs. Some items may interact with your medicine. What should I watch for while using this medicine? Tell your doctor or health care professional if your symptoms do not start to get better or if they get worse. Do not stop taking except on your doctor's advice. You may develop a severe reaction. Your doctor will tell you how much medicine to take. You may get drowsy or dizzy. Do not drive, use machinery, or do anything that needs mental alertness until you know how this medicine affects you. To reduce the risk of dizzy and fainting spells, do not stand or sit up quickly, especially if you are  an older patient. Alcohol may increase dizziness and drowsiness. Avoid alcoholic drinks. If you are taking another medicine that also causes drowsiness, you may have more side effects. Give your health care provider a list of all  medicines you use. Your doctor will tell you how much medicine to take. Do not take more medicine than directed. Call emergency for help if you have problems breathing or unusual sleepiness. The use of this medicine may increase the chance of suicidal thoughts or actions. Pay special attention to how you are responding while on this medicine. Any worsening of mood, or thoughts of suicide or dying should be reported to your health care professional right away. What side effects may I notice from receiving this medicine? Side effects that you should report to your doctor or health care professional as soon as possible:  allergic reactions like skin rash, itching or hives, swelling of the face, lips, or tongue  breathing problems  confusion  loss of balance or coordination  signs and symptoms of low blood pressure like dizziness; feeling faint or lightheaded, falls; unusually weak or tired  suicidal thoughts or mood changes Side effects that usually do not require medical attention (report to your doctor or health care professional if they continue or are bothersome):  dizziness  headache  tiredness  upset stomach This list may not describe all possible side effects. Call your doctor for medical advice about side effects. You may report side effects to FDA at 1-800-FDA-1088. Where should I keep my medicine? Keep out of the reach of children. This medicine can be abused. Keep your medicine in a safe place to protect it from theft. Do not share this medicine with anyone. Selling or giving away this medicine is dangerous and against the law. This medicine may cause accidental overdose and death if taken by other adults, children, or pets. Mix any unused medicine with a substance like cat litter or coffee grounds. Then throw the medicine away in a sealed container like a sealed bag or a coffee can with a lid. Do not use the medicine after the expiration date. Store at room temperature between  15 and 30 degrees C (59 and 86 degrees F). Protect from light. Keep container tightly closed. NOTE: This sheet is a summary. It may not cover all possible information. If you have questions about this medicine, talk to your doctor, pharmacist, or health care provider.  2020 Elsevier/Gold Standard (2016-01-10 18:46:32) Mirtazapine tablets What is this medicine? MIRTAZAPINE (mir TAZ a peen) is used to treat depression. This medicine may be used for other purposes; ask your health care provider or pharmacist if you have questions. COMMON BRAND NAME(S): Remeron What should I tell my health care provider before I take this medicine? They need to know if you have any of these conditions:  bipolar disorder  glaucoma  kidney disease  liver disease  suicidal thoughts  an unusual or allergic reaction to mirtazapine, other medicines, foods, dyes, or preservatives  pregnant or trying to get pregnant  breast-feeding How should I use this medicine? Take this medicine by mouth with a glass of water. Follow the directions on the prescription label. Take your medicine at regular intervals. Do not take your medicine more often than directed. Do not stop taking this medicine suddenly except upon the advice of your doctor. Stopping this medicine too quickly may cause serious side effects or your condition may worsen. A special MedGuide will be given to you by the pharmacist with each prescription  and refill. Be sure to read this information carefully each time. Talk to your pediatrician regarding the use of this medicine in children. Special care may be needed. Overdosage: If you think you have taken too much of this medicine contact a poison control center or emergency room at once. NOTE: This medicine is only for you. Do not share this medicine with others. What if I miss a dose? If you miss a dose, take it as soon as you can. If it is almost time for your next dose, take only that dose. Do not take  double or extra doses. What may interact with this medicine? Do not take this medicine with any of the following medications:  linezolid  MAOIs like Carbex, Eldepryl, Marplan, Nardil, and Parnate  methylene blue (injected into a vein) This medicine may also interact with the following medications:  alcohol  antiviral medicines for HIV or AIDS  certain medicines that treat or prevent blood clots like warfarin  certain medicines for depression, anxiety, or psychotic disturbances  certain medicines for fungal infections like ketoconazole and itraconazole  certain medicines for migraine headache like almotriptan, eletriptan, frovatriptan, naratriptan, rizatriptan, sumatriptan, zolmitriptan  certain medicines for seizures like carbamazepine or phenytoin  certain medicines for sleep  cimetidine  erythromycin  fentanyl  lithium  medicines for blood pressure  nefazodone  rasagiline  rifampin  supplements like St. John's wort, kava kava, valerian  tramadol  tryptophan This list may not describe all possible interactions. Give your health care provider a list of all the medicines, herbs, non-prescription drugs, or dietary supplements you use. Also tell them if you smoke, drink alcohol, or use illegal drugs. Some items may interact with your medicine. What should I watch for while using this medicine? Tell your doctor if your symptoms do not get better or if they get worse. Visit your doctor or health care professional for regular checks on your progress. Because it may take several weeks to see the full effects of this medicine, it is important to continue your treatment as prescribed by your doctor. Patients and their families should watch out for new or worsening thoughts of suicide or depression. Also watch out for sudden changes in feelings such as feeling anxious, agitated, panicky, irritable, hostile, aggressive, impulsive, severely restless, overly excited and  hyperactive, or not being able to sleep. If this happens, especially at the beginning of treatment or after a change in dose, call your health care professional. Bonita Quin may get drowsy or dizzy. Do not drive, use machinery, or do anything that needs mental alertness until you know how this medicine affects you. Do not stand or sit up quickly, especially if you are an older patient. This reduces the risk of dizzy or fainting spells. Alcohol may interfere with the effect of this medicine. Avoid alcoholic drinks. This medicine may cause dry eyes and blurred vision. If you wear contact lenses you may feel some discomfort. Lubricating drops may help. See your eye doctor if the problem does not go away or is severe. Your mouth may get dry. Chewing sugarless gum or sucking hard candy, and drinking plenty of water may help. Contact your doctor if the problem does not go away or is severe. What side effects may I notice from receiving this medicine? Side effects that you should report to your doctor or health care professional as soon as possible:  allergic reactions like skin rash, itching or hives, swelling of the face, lips, or tongue  anxious  changes in  vision  chest pain  confusion  elevated mood, decreased need for sleep, racing thoughts, impulsive behavior  eye pain  fast, irregular heartbeat  feeling faint or lightheaded, falls  feeling agitated, angry, or irritable  fever or chills, sore throat  hallucination, loss of contact with reality  loss of balance or coordination  mouth sores  redness, blistering, peeling or loosening of the skin, including inside the mouth  restlessness, pacing, inability to keep still  seizures  stiff muscles  suicidal thoughts or other mood changes  trouble passing urine or change in the amount of urine  trouble sleeping  unusual bleeding or bruising  unusually weak or tired  vomiting Side effects that usually do not require medical  attention (report to your doctor or health care professional if they continue or are bothersome):  change in appetite  constipation  dizziness  dry mouth  muscle aches or pains  nausea  tired  weight gain This list may not describe all possible side effects. Call your doctor for medical advice about side effects. You may report side effects to FDA at 1-800-FDA-1088. Where should I keep my medicine? Keep out of the reach of children. Store at room temperature between 15 and 30 degrees C (59 and 86 degrees F) Protect from light and moisture. Throw away any unused medicine after the expiration date. NOTE: This sheet is a summary. It may not cover all possible information. If you have questions about this medicine, talk to your doctor, pharmacist, or health care provider.  2020 Elsevier/Gold Standard (2016-01-02 17:30:45)

## 2020-04-02 ENCOUNTER — Other Ambulatory Visit: Payer: Self-pay | Admitting: Psychiatry

## 2020-04-02 DIAGNOSIS — Z79899 Other long term (current) drug therapy: Secondary | ICD-10-CM | POA: Diagnosis not present

## 2020-04-02 DIAGNOSIS — F319 Bipolar disorder, unspecified: Secondary | ICD-10-CM | POA: Diagnosis not present

## 2020-04-08 ENCOUNTER — Ambulatory Visit: Payer: No Typology Code available for payment source | Admitting: Licensed Clinical Social Worker

## 2020-05-07 ENCOUNTER — Telehealth: Payer: No Typology Code available for payment source | Admitting: Psychiatry

## 2020-06-17 ENCOUNTER — Encounter: Payer: Self-pay | Admitting: Family Medicine

## 2020-06-18 NOTE — Telephone Encounter (Signed)
Yes. Just change type to CPE please

## 2020-06-19 ENCOUNTER — Telehealth: Payer: Self-pay

## 2020-06-19 NOTE — Telephone Encounter (Signed)
Copied from CRM [id number]. Topic: General - Other >> Jun 19, 2020 10:01 AM Dalphine Handing A wrote: Patient is scheduled for CPE in January 2022 and wants to know if he still needs to keep AWV appt on 11/8 due to him no longer having medicare. Please advise

## 2020-06-19 NOTE — Telephone Encounter (Signed)
Pt would not be eligible if he does not have medicare. NANM will try back later.

## 2020-06-19 NOTE — Telephone Encounter (Signed)
Tried to contact pt again and still NANM. Apt cancelled for 06/24/20.

## 2020-06-20 NOTE — Telephone Encounter (Signed)
Patient advised as below.  

## 2020-06-24 ENCOUNTER — Other Ambulatory Visit: Payer: Self-pay | Admitting: Family Medicine

## 2020-06-24 ENCOUNTER — Encounter: Payer: Self-pay | Admitting: *Deleted

## 2020-06-24 ENCOUNTER — Ambulatory Visit
Admission: EM | Admit: 2020-06-24 | Discharge: 2020-06-24 | Disposition: A | Discharge status: Home / Self Care | Payer: No Typology Code available for payment source | Attending: Family Medicine | Admitting: Family Medicine

## 2020-06-24 DIAGNOSIS — K122 Cellulitis and abscess of mouth: Secondary | ICD-10-CM | POA: Insufficient documentation

## 2020-06-24 DIAGNOSIS — J029 Acute pharyngitis, unspecified: Secondary | ICD-10-CM | POA: Diagnosis not present

## 2020-06-24 LAB — POCT RAPID STREP A (OFFICE): Rapid Strep A Screen: NEGATIVE

## 2020-06-24 MED ORDER — AMOXICILLIN-POT CLAVULANATE 875-125 MG PO TABS: 1.0000 | ORAL_TABLET | Freq: Two times a day (BID) | ORAL | 0 refills | Status: DC

## 2020-06-24 MED ORDER — DEXAMETHASONE SODIUM PHOSPHATE 10 MG/ML IJ SOLN
10.0000 mg | Freq: Once | INTRAMUSCULAR | Status: AC
  Administered 2020-06-24: 10 mg via INTRAMUSCULAR

## 2020-06-24 MED ORDER — PREDNISONE 10 MG (21) PO TBPK: ORAL_TABLET | Freq: Every day | ORAL | 0 refills | Status: AC

## 2020-06-24 NOTE — ED Triage Notes (Signed)
Patient reports progressively worsening sore throat x 5 days. States swelling and discomfort to throat. Feels like he has had a fever. Reports fatigue.

## 2020-06-24 NOTE — ED Provider Notes (Signed)
[facility]   [id number] 06/24/20 Arrival Time: 1257  YQ:MVHQ THROAT  SUBJECTIVE: History from: patient.  Frederick [name] is a 44 y.o. male who presents with abrupt onset of sore throat for the last 5 days that has progressively gotten worse.  Denies sick exposure to Covid, strep, flu or mono, or precipitating event. Has not attempted OTC treatment. States that his uvula is enlarged. Reports that this has happened 3-4 times previously in his life. Has negative history of Covid. Has completed Covid vaccines. Symptoms are made worse with swallowing, but tolerating liquids and own secretions without difficulty.  Denies previous symptoms in the past.     Denies fever, chills, fatigue, ear pain, sinus pain, rhinorrhea, nasal congestion, cough, SOB, wheezing, chest pain, nausea, rash, changes in bowel or bladder habits.     ROS: As per HPI.  All other pertinent ROS negative.     Past Medical History:  Diagnosis Date  . Allergy   . Anxiety   . Arthritis   . Bipolar disorder (HCC)   . GERD (gastroesophageal reflux disease)   . Vitamin D deficiency    Past Surgical History:  Procedure Laterality Date  . PILONIDAL CYST EXCISION     Allergies  Allergen Reactions  . Alprazolam Itching and Nausea And Vomiting  . Bee Venom   . Clindamycin Hives  . Ziprasidone Hcl Itching    Altered mental status   No current facility-administered medications on file prior to encounter.   Current Outpatient Medications on File Prior to Encounter  Medication Sig Dispense Refill  . Cholecalciferol (VITAMIN D3 MAXIMUM STRENGTH) 125 MCG (5000 UT) capsule Take 5,000 Units by mouth daily.    . clonazePAM (KLONOPIN) 2 MG tablet Take 1 tablet (2 mg total) by mouth 2 (two) times daily as needed for anxiety. 60 tablet 1  . famotidine (PEPCID) 40 MG tablet Take 40 mg by mouth daily. As needed    . lithium carbonate (ESKALITH) 450 MG CR tablet Take 1 tablet (450 mg total) by mouth 2 (two) times daily. 60  tablet 1  . mirtazapine (REMERON) 7.5 MG tablet Take 1 tablet (7.5 mg total) by mouth at bedtime. For sleep and panic attacks 30 tablet 1  . Oxcarbazepine (TRILEPTAL) 300 MG tablet Take 1 tablet (300 mg total) by mouth 2 (two) times daily. 60 tablet 1  . pantoprazole (PROTONIX) 40 MG tablet Take 1 tablet (40 mg total) by mouth daily. 90 tablet 1  . topiramate (TOPAMAX) 50 MG tablet Take 50 mg by mouth every morning.    . topiramate (TOPAMAX) 50 MG tablet Take 1 tablet (50 mg total) by mouth daily. 30 tablet 1   Social History   Socioeconomic History  . Marital status: Married    Spouse name: Not on file  . Number of children: 2  . Years of education: Not on file  . Highest education level: Associate degree: occupational, Scientist, product/process development, or vocational program  Occupational History  . Occupation: disability  Tobacco Use  . Smoking status: Former Smoker    Packs/day: 0.25    Years: 10.00    Pack years: 2.50    Types: Cigarettes    Quit date: 12/17/2019    Years since quitting: 0.5  . Smokeless tobacco: Never Used  . Tobacco comment: quit   Vaping Use  . Vaping Use: Former  Substance and Sexual Activity  . Alcohol use: Not Currently    Alcohol/week: 0.0 standard drinks  . Drug use: No  . Sexual  activity: Yes    Partners: Female  Other Topics Concern  . Not on file  Social History Narrative   Married.   2 children.   Disabled.   Enjoys playing disc golf.    Social Determinants of Health   Financial Resource Strain:   . Difficulty of Paying Living Expenses: Not on file  Food Insecurity:   . Worried About Programme researcher, broadcasting/film/video in the Last Year: Not on file  . Ran Out of Food in the Last Year: Not on file  Transportation Needs:   . Lack of Transportation (Medical): Not on file  . Lack of Transportation (Non-Medical): Not on file  Physical Activity:   . Days of Exercise per Week: Not on file  . Minutes of Exercise per Session: Not on file  Stress:   . Feeling of Stress : Not on  file  Social Connections:   . Frequency of Communication with Friends and Family: Not on file  . Frequency of Social Gatherings with Friends and Family: Not on file  . Attends Religious Services: Not on file  . Active Member of Clubs or Organizations: Not on file  . Attends Banker Meetings: Not on file  . Marital Status: Not on file  Intimate Partner Violence:   . Fear of Current or Ex-Partner: Not on file  . Emotionally Abused: Not on file  . Physically Abused: Not on file  . Sexually Abused: Not on file   Family History  Problem Relation Age of Onset  . Bipolar disorder Mother   . Arthritis Father   . Bipolar disorder Brother   . Bipolar disorder Maternal Uncle   . Bipolar disorder Paternal Uncle   . Cancer Paternal Grandmother        lymphoma?    OBJECTIVE:  Vitals:   06/24/20 1319  BP: 133/89  Pulse: 80  Resp: 17  Temp: 98.9 F (37.2 C)  TempSrc: Oral  SpO2: 98%     General appearance: alert; appears fatigued, but nontoxic, speaking in full sentences and managing own secretions HEENT: NCAT; Ears: EACs clear, TMs pearly gray with visible cone of light, without erythema; Eyes: PERRL, EOMI grossly; Nose: no obvious rhinorrhea; Throat: oropharynx erythematous, tonsils 1+ and mildly erythematous without white tonsillar exudates, uvula 2+ enlarged, erythematous Neck: supple with LAD Lungs: CTA bilaterally without adventitious breath sounds; cough absent Heart: regular rate and rhythm.  Radial pulses 2+ symmetrical bilaterally Skin: warm and dry Psychological: alert and cooperative; normal mood and affect  LABS: Results for orders placed or performed during the hospital encounter of 06/24/20 (from the past 24 hour(s))  POCT rapid strep A     Status: None   Collection Time: 06/24/20  1:35 PM  Result Value Ref Range   Rapid Strep A Screen Negative Negative     ASSESSMENT & PLAN:  1. Uvulitis   2. Sore throat     Meds ordered this encounter    Medications  . dexamethasone (DECADRON) injection 10 mg  . amoxicillin-clavulanate (AUGMENTIN) 875-125 MG tablet    Sig: Take 1 tablet by mouth 2 (two) times daily for 10 days.    Dispense:  20 tablet    Refill:  0    Order Specific Question:   Supervising Provider    Answer:   Merrilee Jansky [id number]  . predniSONE (STERAPRED UNI-PAK 21 TAB) 10 MG (21) TBPK tablet    Sig: Take by mouth daily for 6 days. Take 6 tablets on day 1,  5 tablets on day 2, 4 tablets on day 3, 3 tablets on day 4, 2 tablets on day 5, 1 tablet on day 6    Dispense:  21 tablet    Refill:  0    Order Specific Question:   Supervising Provider    Answer:   Merrilee Jansky [[id number]]   Decadron 10mg  IM given in office Prescribed Augmentin Prescribed steroid taper Strep test negative, will send out for culture and we will call you with results Will treat for uvulitis based on history and clinical presentation Get plenty of rest and push fluids Take OTC Zyrtec and use chloraseptic spray as needed for throat pain. Drink warm or cool liquids, use throat lozenges, or popsicles to help alleviate symptoms Take OTC ibuprofen or tylenol as needed for pain Follow up with PCP if symptoms persists Return or go to ER if patient has any new or worsening symptoms such as fever, chills, nausea, vomiting, worsening sore throat, cough, abdominal pain, chest pain, changes in bowel or bladder habits  Reviewed expectations re: course of current medical issues. Questions answered. Outlined signs and symptoms indicating need for more acute intervention. Patient verbalized understanding. After Visit Summary given.          , NP 06/24/20 1353

## 2020-06-24 NOTE — Discharge Instructions (Addendum)
You have received a steroid injection in the office today  I have sent in Augmentin for you to take twice a day for 7 days.  I have sent in a prednisone taper for you to take for 6 days. 6 tablets on day one, 5 tablets on day two, 4 tablets on day three, 3 tablets on day four, 2 tablets on day five, and 1 tablet on day six.  Your rapid strep test is negative.  A throat culture is pending; we will call you if it is positive requiring treatment.    Follow up with this office or with primary care if symptoms are persisting.  Follow up in the ER for high fever, trouble swallowing, trouble breathing, other concerning symptoms.

## 2020-06-27 LAB — CULTURE, GROUP A STREP (THRC)

## 2020-07-03 ENCOUNTER — Other Ambulatory Visit: Payer: Self-pay | Admitting: Psychiatry

## 2020-07-26 ENCOUNTER — Ambulatory Visit: Payer: Self-pay

## 2020-08-06 ENCOUNTER — Other Ambulatory Visit: Payer: Self-pay | Admitting: Internal Medicine

## 2020-08-06 ENCOUNTER — Ambulatory Visit: Payer: No Typology Code available for payment source | Attending: Internal Medicine

## 2020-08-06 DIAGNOSIS — Z23 Encounter for immunization: Secondary | ICD-10-CM

## 2020-08-06 NOTE — Progress Notes (Signed)
   Covid-19 Vaccination Clinic  Name:  Ehren Berisha    MRN: [id number] DOB: 1975/12/13  08/06/2020  Mr. Frisbee was observed post Covid-19 immunization for 15 minutes without incident. He was provided with Vaccine Information Sheet and instruction to access the V-Safe system.   Mr. Chatterjee was instructed to call 911 with any severe reactions post vaccine: Marland Kitchen Difficulty breathing  . Swelling of face and throat  . A fast heartbeat  . A bad rash all over body  . Dizziness and weakness   Immunizations Administered    Name Date Dose VIS Date Route   Pfizer COVID-19 Vaccine 08/06/2020 12:18 PM 0.3 mL 06/05/2020 Intramuscular   Manufacturer: [employer], [employer]   Lot: [id number]   NDC: [address]-7

## 2020-08-14 ENCOUNTER — Other Ambulatory Visit: Payer: Self-pay | Admitting: Psychiatry

## 2020-08-14 DIAGNOSIS — Z79899 Other long term (current) drug therapy: Secondary | ICD-10-CM | POA: Diagnosis not present

## 2020-08-14 DIAGNOSIS — F319 Bipolar disorder, unspecified: Secondary | ICD-10-CM | POA: Diagnosis not present

## 2020-09-09 ENCOUNTER — Encounter: Payer: Self-pay | Admitting: Family Medicine

## 2020-09-09 ENCOUNTER — Telehealth: Payer: No Typology Code available for payment source | Admitting: Family Medicine

## 2020-09-09 NOTE — Patient Instructions (Incomplete)
Gastroesophageal Reflux Disease, Adult  Gastroesophageal reflux (GER) happens when acid from the stomach flows up into the tube that connects the mouth and the stomach (esophagus). Normally, food travels down the esophagus and stays in the stomach to be digested. With GER, food and stomach acid sometimes move back up into the esophagus. You may have a disease called gastroesophageal reflux disease (GERD) if the reflux:  Happens often.  Causes frequent or very bad symptoms.  Causes problems such as damage to the esophagus. When this happens, the esophagus becomes sore and swollen. Over time, GERD can make small holes (ulcers) in the lining of the esophagus. What are the causes? This condition is caused by a problem with the muscle between the esophagus and the stomach. When this muscle is weak or not normal, it does not close properly to keep food and acid from coming back up from the stomach. The muscle can be weak because of:  Tobacco use.  Pregnancy.  Having a certain type of hernia (hiatal hernia).  Alcohol use.  Certain foods and drinks, such as coffee, chocolate, onions, and peppermint. What increases the risk?  Being overweight.  Having a disease that affects your connective tissue.  Taking NSAIDs, such a ibuprofen. What are the signs or symptoms?  Heartburn.  Difficult or painful swallowing.  The feeling of having a lump in the throat.  A bitter taste in the mouth.  Bad breath.  Having a lot of saliva.  Having an upset or bloated stomach.  Burping.  Chest pain. Different conditions can cause chest pain. Make sure you see your doctor if you have chest pain.  Shortness of breath or wheezing.  A long-term cough or a cough at night.  Wearing away of the surface of teeth (tooth enamel).  Weight loss. How is this treated?  Making changes to your diet.  Taking medicine.  Having surgery. Treatment will depend on how bad your symptoms are. Follow these  instructions at home: Eating and drinking  Follow a diet as told by your doctor. You may need to avoid foods and drinks such as: ? Coffee and tea, with or without caffeine. ? Drinks that contain alcohol. ? Energy drinks and sports drinks. ? Bubbly (carbonated) drinks or sodas. ? Chocolate and cocoa. ? Peppermint and mint flavorings. ? Garlic and onions. ? Horseradish. ? Spicy and acidic foods. These include peppers, chili powder, curry powder, vinegar, hot sauces, and BBQ sauce. ? Citrus fruit juices and citrus fruits, such as oranges, lemons, and limes. ? Tomato-based foods. These include red sauce, chili, salsa, and pizza with red sauce. ? Fried and fatty foods. These include donuts, french fries, potato chips, and high-fat dressings. ? High-fat meats. These include hot dogs, rib eye steak, sausage, ham, and bacon. ? High-fat dairy items, such as whole milk, butter, and cream cheese.  Eat small meals often. Avoid eating large meals.  Avoid drinking large amounts of liquid with your meals.  Avoid eating meals during the 2-3 hours before bedtime.  Avoid lying down right after you eat.  Do not exercise right after you eat.   Lifestyle  Do not smoke or use any products that contain nicotine or tobacco. If you need help quitting, ask your doctor.  Try to lower your stress. If you need help doing this, ask your doctor.  If you are overweight, lose an amount of weight that is healthy for you. Ask your doctor about a safe weight loss goal.   General instructions    Pay attention to any changes in your symptoms.  Take over-the-counter and prescription medicines only as told by your doctor.  Do not take aspirin, ibuprofen, or other NSAIDs unless your doctor says it is okay.  Wear loose clothes. Do not wear anything tight around your waist.  Raise (elevate) the head of your bed about 6 inches (15 cm). You may need to use a wedge to do this.  Avoid bending over if this makes your  symptoms worse.  Keep all follow-up visits. Contact a doctor if:  You have new symptoms.  You lose weight and you do not know why.  You have trouble swallowing or it hurts to swallow.  You have wheezing or a cough that keeps happening.  You have a hoarse voice.  Your symptoms do not get better with treatment. Get help right away if:  You have sudden pain in your arms, neck, jaw, teeth, or back.  You suddenly feel sweaty, dizzy, or light-headed.  You have chest pain or shortness of breath.  You vomit and the vomit is green, yellow, or black, or it looks like blood or coffee grounds.  You faint.  Your poop (stool) is red, bloody, or black.  You cannot swallow, drink, or eat. These symptoms may represent a serious problem that is an emergency. Do not wait to see if the symptoms will go away. Get medical help right away. Call your local emergency services (911 in the [place] Do not drive yourself to the hospital. Summary  If a person has gastroesophageal reflux disease (GERD), food and stomach acid move back up into the esophagus and cause symptoms or problems such as damage to the esophagus.  Treatment will depend on how bad your symptoms are.  Follow a diet as told by your doctor.  Take all medicines only as told by your doctor. This information is not intended to replace advice given to you by your health care provider. Make sure you discuss any questions you have with your health care provider. Document Revised: 02/12/2020 Document Reviewed: 02/12/2020 Elsevier Patient Education  2021 [employer]  

## 2020-09-09 NOTE — Progress Notes (Signed)
MyChart Video Visit    Virtual Visit via Video Note   This visit type was conducted due to national recommendations for restrictions regarding the COVID-19 Pandemic (e.g. social distancing) in an effort to limit this patient's exposure and mitigate transmission in our community. This patient is at least at moderate risk for complications without adequate follow up. This format is felt to be most appropriate for this patient at this time. Physical exam was limited by quality of the video and audio technology used for the visit.    Patient location: home Provider location: home office  Persons involved in the visit: patient, provider  I discussed the limitations of evaluation and management by telemedicine and the availability of in person appointments. The patient expressed understanding and agreed to proceed.  Patient: Frederick [name]   DOB: 1975-12-07   45 y.o. Male  MRN: [id number] Visit Date: 09/10/2020  Today's healthcare provider: Shirlee Latch, MD   Chief Complaint  Patient presents with   Follow-up   Subjective    HPI   Patient reports he is moving to [place] on October 18, 2020. Patient requesting a referral to [facility] for medication management. Patient reports good compliance and tolerance of medications.  Fax number [phone]. Currently seeing Dr Janeece Riggers and will have enough meds to get him through to seeing new psychiatrist.  -----------------------------------------------------------------------------------------  Patient Active Problem List   Diagnosis Date Noted   Panic attacks 03/18/2020   Insomnia 03/18/2020   Vitamin D deficiency    Bipolar 1 disorder, depressed, full remission (HCC) 08/28/2019   High risk medication use 04/05/2018   Anxiety 04/04/2018   GERD (gastroesophageal reflux disease) 09/30/2016   Bipolar disorder, unspecified (HCC) 01/04/2012   Social History   Tobacco Use   Smoking  status: Current Every Day Smoker    Packs/day: 0.25    Years: 10.00    Pack years: 2.50    Types: Cigarettes    Last attempt to quit: 12/17/2019    Years since quitting: 0.7   Smokeless tobacco: Never Used   Tobacco comment: quit   Vaping Use   Vaping Use: Former  Substance Use Topics   Alcohol use: Not Currently    Alcohol/week: 0.0 standard drinks   Drug use: No   Allergies  Allergen Reactions   Alprazolam Itching and Nausea And Vomiting   Bee Venom    Clindamycin Hives   Ziprasidone Hcl Itching    Altered mental status      Medications: Outpatient Medications Prior to Visit  Medication Sig   Cholecalciferol (VITAMIN D3 MAXIMUM STRENGTH) 125 MCG (5000 UT) capsule Take 5,000 Units by mouth daily.   clonazePAM (KLONOPIN) 2 MG tablet Take 1 tablet (2 mg total) by mouth 2 (two) times daily as needed for anxiety. (Patient taking differently: Take 2 mg by mouth 4 (four) times daily as needed for anxiety.)   famotidine (PEPCID) 40 MG tablet Take 40 mg by mouth daily. As needed   lithium carbonate (ESKALITH) 450 MG CR tablet Take 1 tablet (450 mg total) by mouth 2 (two) times daily.   Oxcarbazepine (TRILEPTAL) 300 MG tablet Take 1 tablet (300 mg total) by mouth 2 (two) times daily.   pantoprazole (PROTONIX) 40 MG tablet Take 1 tablet (40 mg total) by mouth daily.   topiramate (TOPAMAX) 50 MG tablet Take 1 tablet (50 mg total) by mouth daily. (Patient taking differently: Take 50 mg by mouth 2 (two) times daily.)   [DISCONTINUED]  mirtazapine (REMERON) 7.5 MG tablet Take 1 tablet (7.5 mg total) by mouth at bedtime. For sleep and panic attacks   [DISCONTINUED] topiramate (TOPAMAX) 50 MG tablet Take 50 mg by mouth every morning. (Patient not taking: Reported on 09/10/2020)   No facility-administered medications prior to visit.    Review of Systems  Constitutional: Negative for activity change, appetite change and fatigue.  Respiratory: Negative for chest tightness and  shortness of breath.   Cardiovascular: Negative for chest pain and palpitations.    Last CBC Lab Results  Component Value Date   WBC 7.6 06/08/2019   HGB 15.6 06/08/2019   HCT 47.4 06/08/2019   MCV 92 06/08/2019   MCH 30.1 06/08/2019   RDW 12.2 06/08/2019   PLT 253 06/08/2019   Last metabolic panel Lab Results  Component Value Date   GLUCOSE 98 06/08/2019   NA 141 06/08/2019   K 4.3 06/08/2019   CL 111 (H) 06/08/2019   CO2 18 (L) 06/08/2019   BUN 12 06/08/2019   CREATININE 1.08 06/08/2019   GFRNONAA 82 06/08/2019   GFRAA 95 06/08/2019   CALCIUM 9.3 06/08/2019   PROT 6.5 06/08/2019   ALBUMIN 4.5 06/08/2019   LABGLOB 2.0 06/08/2019   AGRATIO 2.3 (H) 06/08/2019   BILITOT 0.3 06/08/2019   ALKPHOS 106 06/08/2019   AST 28 06/08/2019   ALT 59 (H) 06/08/2019   Last lipids Lab Results  Component Value Date   CHOL 204 (H) 06/08/2019   HDL 36 (L) 06/08/2019   LDLCALC 136 (H) 06/08/2019   TRIG 180 (H) 06/08/2019   CHOLHDL 5.7 (H) 06/08/2019   Last hemoglobin A1c Lab Results  Component Value Date   HGBA1C 5.4 06/08/2019   Last thyroid functions Lab Results  Component Value Date   TSH 1.790 06/08/2019   Last vitamin D Lab Results  Component Value Date   VD25OH 28.4 (L) 06/08/2019   Last vitamin B12 and Folate Lab Results  Component Value Date   VITAMINB12 1,579 (H) 06/08/2019      Objective    Ht 6\' 2"  (1.88 m)    Wt 234 lb (106.1 kg)    BMI 30.04 kg/m  BP Readings from Last 3 Encounters:  06/24/20 133/89  06/08/19 117/88  09/07/18 129/87   Wt Readings from Last 3 Encounters:  09/10/20 234 lb (106.1 kg)  06/08/19 243 lb (110.2 kg)  09/07/18 249 lb 12.8 oz (113.3 kg)      Physical Exam Constitutional:      General: He is not in acute distress.    Appearance: Normal appearance.  HENT:     Head: Normocephalic.  Pulmonary:     Effort: Pulmonary effort is normal. No respiratory distress.  Neurological:     Mental Status: He is alert. Mental  status is at baseline.  Psychiatric:        Behavior: Behavior normal.        Assessment & Plan     1. Bipolar 1 disorder, depressed, full remission (HCC) 2. Insomnia, unspecified type 3. Panic attacks 4. Anxiety - chronic and stable - followed by Dr 09/09/18, but will be relocating soon, so new referral placed to behavioral health near new home - no changes to medications at this time - Ambulatory referral to Psychiatry   Return for as scheduled.     I discussed the assessment and treatment plan with the patient. The patient was provided an opportunity to ask questions and all were answered. The patient agreed with the plan  and demonstrated an understanding of the instructions.   The patient was advised to call back or seek an in-person evaluation if the symptoms worsen or if the condition fails to improve as anticipated.    I, Shirlee Latch, MD, have reviewed all documentation for this visit. The documentation on 09/10/20 for the exam, diagnosis, procedures, and orders are all accurate and complete.   Frederick [name], Marzella Schlein, MD, MPH [facility]

## 2020-09-10 ENCOUNTER — Telehealth (INDEPENDENT_AMBULATORY_CARE_PROVIDER_SITE_OTHER): Payer: No Typology Code available for payment source | Admitting: Family Medicine

## 2020-09-10 ENCOUNTER — Telehealth: Payer: No Typology Code available for payment source | Admitting: Family Medicine

## 2020-09-10 ENCOUNTER — Encounter: Payer: Self-pay | Admitting: Family Medicine

## 2020-09-10 VITALS — Ht 74.0 in | Wt 234.0 lb

## 2020-09-10 DIAGNOSIS — K219 Gastro-esophageal reflux disease without esophagitis: Secondary | ICD-10-CM

## 2020-09-10 DIAGNOSIS — F3176 Bipolar disorder, in full remission, most recent episode depressed: Secondary | ICD-10-CM | POA: Diagnosis not present

## 2020-09-10 DIAGNOSIS — G4700 Insomnia, unspecified: Secondary | ICD-10-CM

## 2020-09-10 DIAGNOSIS — F419 Anxiety disorder, unspecified: Secondary | ICD-10-CM

## 2020-09-10 DIAGNOSIS — F410 Panic disorder [episodic paroxysmal anxiety] without agoraphobia: Secondary | ICD-10-CM | POA: Diagnosis not present

## 2020-09-19 ENCOUNTER — Other Ambulatory Visit: Payer: Self-pay

## 2020-09-19 ENCOUNTER — Ambulatory Visit (INDEPENDENT_AMBULATORY_CARE_PROVIDER_SITE_OTHER): Payer: No Typology Code available for payment source | Admitting: Family Medicine

## 2020-09-19 ENCOUNTER — Other Ambulatory Visit: Payer: Self-pay | Admitting: Family Medicine

## 2020-09-19 ENCOUNTER — Encounter: Payer: Self-pay | Admitting: Family Medicine

## 2020-09-19 VITALS — BP 110/90 | HR 77 | Temp 98.3°F | Resp 16 | Ht 74.0 in | Wt 241.0 lb

## 2020-09-19 DIAGNOSIS — Z79899 Other long term (current) drug therapy: Secondary | ICD-10-CM | POA: Diagnosis not present

## 2020-09-19 DIAGNOSIS — E559 Vitamin D deficiency, unspecified: Secondary | ICD-10-CM | POA: Diagnosis not present

## 2020-09-19 DIAGNOSIS — L732 Hidradenitis suppurativa: Secondary | ICD-10-CM

## 2020-09-19 DIAGNOSIS — Z0000 Encounter for general adult medical examination without abnormal findings: Secondary | ICD-10-CM | POA: Diagnosis not present

## 2020-09-19 DIAGNOSIS — F419 Anxiety disorder, unspecified: Secondary | ICD-10-CM | POA: Diagnosis not present

## 2020-09-19 DIAGNOSIS — Z23 Encounter for immunization: Secondary | ICD-10-CM

## 2020-09-19 DIAGNOSIS — Z1211 Encounter for screening for malignant neoplasm of colon: Secondary | ICD-10-CM

## 2020-09-19 DIAGNOSIS — Z1159 Encounter for screening for other viral diseases: Secondary | ICD-10-CM | POA: Diagnosis not present

## 2020-09-19 DIAGNOSIS — E538 Deficiency of other specified B group vitamins: Secondary | ICD-10-CM

## 2020-09-19 DIAGNOSIS — F3177 Bipolar disorder, in partial remission, most recent episode mixed: Secondary | ICD-10-CM

## 2020-09-19 MED ORDER — DOXYCYCLINE HYCLATE 100 MG PO TABS: 100.0000 mg | ORAL_TABLET | Freq: Two times a day (BID) | ORAL | 0 refills | Status: DC

## 2020-09-19 NOTE — Assessment & Plan Note (Signed)
Followed by Psych continue current meds

## 2020-09-19 NOTE — Assessment & Plan Note (Signed)
Suspect hidradenitis given recurrent abscesses in groin, axillae, buttocks 2 present currently in early stage No fluctuance amenable to I&D Treat with Doxy Consider derm referral in the future

## 2020-09-19 NOTE — Progress Notes (Signed)
Complete physical exam   Patient: Frederick [name]   DOB: [birth year]   44 y.o. Male  MRN: [id number] Visit Date: 09/19/2020  Today's healthcare provider: Shirlee Latch, MD   Chief Complaint  Patient presents with  . Annual Exam   Subjective    Frederick [name] is a 45 y.o. male who presents today for a complete physical exam.  He reports consuming a [name] diet. Home exercise routine includes walking 3 hrs per day. He generally feels well. He reports sleeping poorly. He does have additional problems to discuss today.    HPI  Patient reports he got into chiggers and had one open wound that opened up again this week. Wound is on right butt cheek.   Patient C/O getting ingrown hairs all the times. Usually in axillae, groin, legs. Does not shave these areas  Past Medical History:  Diagnosis Date  . Allergy   . Anxiety   . Arthritis   . Bipolar disorder (HCC)   . GERD (gastroesophageal reflux disease)   . Vitamin D deficiency    Past Surgical History:  Procedure Laterality Date  . PILONIDAL CYST EXCISION     Social History   Socioeconomic History  . Marital status: Married    Spouse name: Not on file  . Number of children: 2  . Years of education: Not on file  . Highest education level: Associate degree: occupational, Scientist, product/process development, or vocational program  Occupational History  . Occupation: disability  Tobacco Use  . Smoking status: Current Every Day Smoker    Packs/day: 0.25    Years: 10.00    Pack years: 2.50    Types: Cigarettes    Last attempt to quit: 12/17/2019    Years since quitting: 0.7  . Smokeless tobacco: Never Used  . Tobacco comment: quit   Vaping Use  . Vaping Use: Former  Substance and Sexual Activity  . Alcohol use: Not Currently    Alcohol/week: 0.0 standard drinks  . Drug use: No  . Sexual activity: Yes    Partners: Female  Other Topics Concern  . Not on file  Social History Narrative   Married.   2 children.   Disabled.   Enjoys  playing disc golf.    Social Determinants of Health   Financial Resource Strain: Not on file  Food Insecurity: Not on file  Transportation Needs: Not on file  Physical Activity: Not on file  Stress: Not on file  Social Connections: Not on file  Intimate Partner Violence: Not on file   Family Status  Relation Name Status  . Mother  Deceased       19-Sep-2020 of COVID  . Father  Alive  . Brother  Alive  . Mat Uncle  (Not Specified)  . Oneal Grout  (Not Specified)  . MGM  Deceased  . MGF  Deceased  . PGM  Deceased  . PGF  Deceased  . Son  Alive  . Son  Alive   Family History  Problem Relation Age of Onset  . Bipolar disorder Mother   . Arthritis Father   . Hypertension Father   . Bipolar disorder Brother   . Bipolar disorder Maternal Uncle   . Bipolar disorder Paternal Uncle   . Cancer Paternal Grandmother        lymphoma?   Allergies  Allergen Reactions  . Alprazolam Itching and Nausea And Vomiting  . Bee Venom   . Clindamycin Hives  . Ziprasidone Hcl Itching  Altered mental status    Patient Care Team: Erasmo Downer, MD as PCP - [name] (Family Medicine) Zena Amos, MD as Consulting Physician (Psychiatry)   Medications: Outpatient Medications Prior to Visit  Medication Sig  . Cholecalciferol (VITAMIN D3 MAXIMUM STRENGTH) 125 MCG (5000 UT) capsule Take 5,000 Units by mouth daily.  . clonazePAM (KLONOPIN) 2 MG tablet Take 1 tablet (2 mg total) by mouth 2 (two) times daily as needed for anxiety. (Patient taking differently: Take 2 mg by mouth 4 (four) times daily as needed for anxiety.)  . famotidine (PEPCID) 40 MG tablet Take 40 mg by mouth daily. As needed  . lithium carbonate (ESKALITH) 450 MG CR tablet Take 1 tablet (450 mg total) by mouth 2 (two) times daily.  . Oxcarbazepine (TRILEPTAL) 300 MG tablet Take 1 tablet (300 mg total) by mouth 2 (two) times daily.  . pantoprazole (PROTONIX) 40 MG tablet Take 1 tablet (40 mg total) by mouth daily.  Marland Kitchen  topiramate (TOPAMAX) 50 MG tablet Take 1 tablet (50 mg total) by mouth daily. (Patient taking differently: Take 50 mg by mouth 2 (two) times daily.)   No facility-administered medications prior to visit.    Review of Systems  Constitutional: Negative.   HENT: Negative.   Eyes: Positive for photophobia.  Respiratory: Negative.   Cardiovascular: Negative.   Gastrointestinal: Negative.   Endocrine: Positive for cold intolerance.  Genitourinary: Positive for frequency.  Musculoskeletal: Negative.   Skin: Positive for wound.  Allergic/Immunologic: Negative.   Neurological: Positive for tremors and seizures.  Hematological: Negative.   Psychiatric/Behavioral: Positive for agitation, behavioral problems, decreased concentration and sleep disturbance. The patient is nervous/anxious and is hyperactive.     Last CBC Lab Results  Component Value Date   WBC 7.6 06/08/2019   HGB 15.6 06/08/2019   HCT 47.4 06/08/2019   MCV 92 06/08/2019   MCH 30.1 06/08/2019   RDW 12.2 06/08/2019   PLT 253 06/08/2019   Last metabolic panel Lab Results  Component Value Date   GLUCOSE 98 06/08/2019   NA 141 06/08/2019   K 4.3 06/08/2019   CL 111 (H) 06/08/2019   CO2 18 (L) 06/08/2019   BUN 12 06/08/2019   CREATININE 1.08 06/08/2019   GFRNONAA 82 06/08/2019   GFRAA 95 06/08/2019   CALCIUM 9.3 06/08/2019   PROT 6.5 06/08/2019   ALBUMIN 4.5 06/08/2019   LABGLOB 2.0 06/08/2019   AGRATIO 2.3 (H) 06/08/2019   BILITOT 0.3 06/08/2019   ALKPHOS 106 06/08/2019   AST 28 06/08/2019   ALT 59 (H) 06/08/2019   Last lipids Lab Results  Component Value Date   CHOL 204 (H) 06/08/2019   HDL 36 (L) 06/08/2019   LDLCALC 136 (H) 06/08/2019   TRIG 180 (H) 06/08/2019   CHOLHDL 5.7 (H) 06/08/2019   Last hemoglobin A1c Lab Results  Component Value Date   HGBA1C 5.4 06/08/2019   Last thyroid functions Lab Results  Component Value Date   TSH 1.790 06/08/2019   Last vitamin D Lab Results  Component  Value Date   VD25OH 28.4 (L) 06/08/2019   Last vitamin B12 and Folate Lab Results  Component Value Date   VITAMINB12 1,579 (H) 06/08/2019      Objective    BP 110/90 (BP Location: Left Arm, Patient Position: Sitting, Cuff Size: Large)   Pulse 77   Temp 98.3 F (36.8 C) (Oral)   Resp 16   Ht 6\' 2"  (1.88 m)   Wt 241 lb (109.3 kg)  SpO2 97%   BMI 30.94 kg/m  BP Readings from Last 3 Encounters:  09/19/20 110/90  06/24/20 133/89  06/08/19 117/88   Wt Readings from Last 3 Encounters:  09/19/20 241 lb (109.3 kg)  09/10/20 234 lb (106.1 kg)  06/08/19 243 lb (110.2 kg)      Physical Exam Vitals reviewed.  Constitutional:      [name]: He is not in acute distress.    Appearance: Normal appearance. He is well-developed. He is not diaphoretic.  HENT:     Head: Normocephalic and atraumatic.     Right Ear: Tympanic membrane, ear canal and external ear normal.     Left Ear: Tympanic membrane, ear canal and external ear normal.  Eyes:     [name]: No scleral icterus.    Conjunctiva/sclera: Conjunctivae normal.     Pupils: Pupils are equal, round, and reactive to light.  Neck:     Thyroid: No thyromegaly.  Cardiovascular:     Rate and Rhythm: Normal rate and regular rhythm.     Pulses: Normal pulses.     Heart sounds: Normal heart sounds. No murmur heard.   Pulmonary:     Effort: Pulmonary effort is normal. No respiratory distress.     Breath sounds: Normal breath sounds. No wheezing or rales.  Abdominal:     [name]: There is no distension.     Palpations: Abdomen is soft.     Tenderness: There is no abdominal tenderness.  Musculoskeletal:        [name]: No deformity.     Cervical back: Neck supple.     Right lower leg: No edema.     Left lower leg: No edema.  Lymphadenopathy:     Cervical: No cervical adenopathy.  Skin:    [name]: Skin is warm and dry.     Findings: Erythema and lesion (induration, no fluctuance, R buttocks and suprapubic area) present. No  rash.  Neurological:     Mental Status: He is alert and oriented to person, place, and time. Mental status is at baseline.     Sensory: No sensory deficit.     Motor: No weakness.     Gait: Gait normal.  Psychiatric:        Mood and Affect: Mood normal.        Behavior: Behavior normal.        Thought Content: Thought content normal.       Last depression screening scores PHQ 2/9 Scores 09/19/2020 06/20/2019 09/07/2018  PHQ - 2 Score 3 0 2  PHQ- 9 Score 9 - 11   Last fall risk screening Fall Risk  06/20/2019  Falls in the past year? 0  Comment -  Number falls in past yr: 0  Comment -  Injury with Fall? 0   Last Audit-C alcohol use screening Alcohol Use Disorder Test (AUDIT) 09/19/2020  1. How often do you have a drink containing alcohol? 0  2. How many drinks containing alcohol do you have on a typical day when you are drinking? 0  3. How often do you have six or more drinks on one occasion? 0  AUDIT-C Score 0  Alcohol Brief Interventions/Follow-up AUDIT Score <7 follow-up not indicated   A score of 3 or more in women, and 4 or more in men indicates increased risk for alcohol abuse, EXCEPT if all of the points are from question 1   No results found for any visits on 09/19/20.  Assessment & Plan    Routine  Health Maintenance and Physical Exam  Exercise Activities and Dietary recommendations Goals    . DIET - DECREASE SODA OR JUICE INTAKE     Recommend to cut back to no more than 1 soda a day and increase water intake.     . Have 3 meals a day     Recommend to decrease portion sizes by eating 3 small healthy meals and at least 2 healthy snacks per day.         . weight management     Target weight is 200lbs. Starting 02/05/17, I will continue to play disc golf for at least 2 hours or more as schedule permits.        Immunization History  Administered Date(s) Administered  . Influenza,inj,Quad PF,6+ Mos 04/29/2016, 05/26/2018, 06/08/2019, 09/19/2020  .  Influenza-Unspecified 04/29/2016  . PFIZER(Purple Top)SARS-COV-2 Vaccination 10/07/2019, 11/01/2019, 08/06/2020    Health Maintenance  Topic Date Due  . Hepatitis C Screening  Never done  . COLONOSCOPY (Pts 45-79yrs Insurance coverage will need to be confirmed)  Never done  . TETANUS/TDAP  02/06/2027 (Originally 01/23/1993)  . HIV Screening  02/05/2099 (Originally 01/23/1989)  . INFLUENZA VACCINE  Completed  . COVID-19 Vaccine  Completed    Discussed health benefits of physical activity, and encouraged him to engage in regular exercise appropriate for his age and condition.  Problem List Items Addressed This Visit      Musculoskeletal and Integument   Hidradenitis suppurativa    Suspect hidradenitis given recurrent abscesses in groin, axillae, buttocks 2 present currently in early stage No fluctuance amenable to I&D Treat with Doxy Consider derm referral in the future        Other   Bipolar disorder, unspecified (HCC)    Followed by psych No changes to meds Check CBC, TSH, CMP as taking lithium and Trileptal      Anxiety    Followed by Psych continue current meds      Vitamin D deficiency    Recheck Vit  D      Relevant Orders   VITAMIN D 25 Hydroxy (Vit-D Deficiency, Fractures)    Other Visit Diagnoses    Encounter for annual health examination    -  Primary   Relevant Orders   Comprehensive metabolic panel   Lipid Panel With LDL/HDL Ratio   B12   TSH   CBC   Long-term use of high-risk medication       Relevant Orders   B12   TSH   CBC   Encounter for hepatitis C screening test for low risk patient       Relevant Orders   Hepatitis C antibody   Screening for colon cancer       Relevant Orders   Ambulatory referral to Gastroenterology   B12 deficiency       Relevant Orders   B12   Need for influenza vaccination       Relevant Orders   Flu Vaccine QUAD 36+ mos IM (Completed)       Return in about 1 year (around 09/19/2021) for CPE (with new PCP as  he is moving).     I, Shirlee Latch, MD, have reviewed all documentation for this visit. The documentation on 09/19/20 for the exam, diagnosis, procedures, and orders are all accurate and complete.   Stephano Arrants, Marzella Schlein, MD, MPH [facility]

## 2020-09-19 NOTE — Assessment & Plan Note (Signed)
Followed by psych No changes to meds Check CBC, TSH, CMP as taking lithium and Trileptal

## 2020-09-19 NOTE — Patient Instructions (Addendum)
Preventive Care 40-47 Years Old, Male Preventive care refers to lifestyle choices and visits with your health care provider that can promote health and wellness. This includes:  A yearly physical exam. This is also called an annual wellness visit.  Regular dental and eye exams.  Immunizations.  Screening for certain conditions.  Healthy lifestyle choices, such as: ? Eating a healthy diet. ? Getting regular exercise. ? Not using drugs or products that contain nicotine and tobacco. ? Limiting alcohol use. What can I expect for my preventive care visit? Physical exam Your health care provider will check your:  Height and weight. These may be used to calculate your BMI (body mass index). BMI is a measurement that tells if you are at a healthy weight.  Heart rate and blood pressure.  Body temperature.  Skin for abnormal spots. Counseling Your health care provider may ask you questions about your:  Past medical problems.  Family's medical history.  Alcohol, tobacco, and drug use.  Emotional well-being.  Home life and relationship well-being.  Sexual activity.  Diet, exercise, and sleep habits.  Work and work environment.  Access to firearms. What immunizations do I need? Vaccines are usually given at various ages, according to a schedule. Your health care provider will recommend vaccines for you based on your age, medical history, and lifestyle or other factors, such as travel or where you work.   What tests do I need? Blood tests  Lipid and cholesterol levels. These may be checked every 5 years, or more often if you are over 50 years old.  Hepatitis C test.  Hepatitis B test. Screening  Lung cancer screening. You may have this screening every year starting at age 55 if you have a 30-pack-year history of smoking and currently smoke or have quit within the past 15 years.  Prostate cancer screening. Recommendations will vary depending on your family history and  other risks.  Genital exam to check for testicular cancer or hernias.  Colorectal cancer screening. ? All adults should have this screening starting at age 50 and continuing until age 75. ? Your health care provider may recommend screening at age 45 if you are at increased risk. ? You will have tests every 1-10 years, depending on your results and the type of screening test.  Diabetes screening. ? This is done by checking your blood sugar (glucose) after you have not eaten for a while (fasting). ? You may have this done every 1-3 years.  STD (sexually transmitted disease) testing, if you are at risk. Follow these instructions at home: Eating and drinking  Eat a diet that includes fresh fruits and vegetables, whole grains, lean protein, and low-fat dairy products.  Take vitamin and mineral supplements as recommended by your health care provider.  Do not drink alcohol if your health care provider tells you not to drink.  If you drink alcohol: ? Limit how much you have to 0-2 drinks a day. ? Be aware of how much alcohol is in your drink. In the [place] one drink equals one 12 oz bottle of beer (355 mL), one 5 oz glass of wine (148 mL), or one 1 oz glass of hard liquor (44 mL).   Lifestyle  Take daily care of your teeth and gums. Brush your teeth every morning and night with fluoride toothpaste. Floss one time each day.  Stay active. Exercise for at least 30 minutes 5 or more days each week.  Do not use any products that contain nicotine or   tobacco, such as cigarettes, e-cigarettes, and chewing tobacco. If you need help quitting, ask your health care provider.  Do not use drugs.  If you are sexually active, practice safe sex. Use a condom or other form of protection to prevent STIs (sexually transmitted infections).  If told by your health care provider, take low-dose aspirin daily starting at age 29.  Find healthy ways to cope with stress, such as: ? Meditation, yoga, or  listening to music. ? Journaling. ? Talking to a trusted person. ? Spending time with friends and family. Safety  Always wear your seat belt while driving or riding in a vehicle.  Do not drive: ? If you have been drinking alcohol. Do not ride with someone who has been drinking. ? When you are tired or distracted. ? While texting.  Wear a helmet and other protective equipment during sports activities.  If you have firearms in your house, make sure you follow all gun safety procedures. What's next?  Go to your health care provider once a year for an annual wellness visit.  Ask your health care provider how often you should have your eyes and teeth checked.  Stay up to date on all vaccines. This information is not intended to replace advice given to you by your health care provider. Make sure you discuss any questions you have with your health care provider. Document Revised: 05/02/2019 Document Reviewed: 07/28/2018 Elsevier Patient Education  2021 Elsevier Inc.    Hidradenitis Suppurativa Hidradenitis suppurativa is a long-term (chronic) skin disease. It is similar to a severe form of acne, but it affects areas of the body where acne would be unusual, especially areas of the body where skin rubs against skin and becomes moist. These include:  Underarms.  Groin.  Genital area.  Buttocks.  Upper thighs.  Breasts. Hidradenitis suppurativa may start out as small lumps or pimples caused by blocked sweat glands or hair follicles. Pimples may develop into deep sores that break open (rupture) and drain pus. Over time, affected areas of skin may thicken and become scarred. This condition is rare and does not spread from person to person (non-contagious). What are the causes? The exact cause of this condition is not known. It may be related to:  Male and male hormones.  An overactive disease-fighting system (immune system). The immune system may over-react to blocked hair  follicles or sweat glands and cause swelling and pus-filled sores. What increases the risk? You are more likely to develop this condition if you:  Are male.  Are 65-17 years old.  Have a family history of hidradenitis suppurativa.  Have a personal history of acne.  Are overweight.  Smoke.  Take the medicine lithium. What are the signs or symptoms? The first symptoms are usually painful bumps in the skin, similar to pimples. The condition may get worse over time (progress), or it may only cause mild symptoms. If the disease progresses, symptoms may include:  Skin bumps getting bigger and growing deeper into the skin.  Bumps rupturing and draining pus.  Itchy, infected skin.  Skin getting thicker and scarred.  Tunnels under the skin (fistulas) where pus drains from a bump.  Pain during daily activities, such as pain during walking if your groin area is affected.  Emotional problems, such as stress or depression. This condition may affect your appearance and your ability or willingness to wear certain clothes or do certain activities. How is this diagnosed? This condition is diagnosed by a health care provider who  specializes in skin diseases (dermatologist). You may be diagnosed based on:  Your symptoms and medical history.  A physical exam.  Testing a pus sample for infection.  Blood tests. How is this treated? Your treatment will depend on how severe your symptoms are. The same treatment will not work for everybody with this condition. You may need to try several treatments to find what works best for you. Treatment may include:  Cleaning and bandaging (dressing) your wounds as needed.  Lifestyle changes, such as new skin care routines.  Taking medicines, such as: ? Antibiotics. ? Acne medicines. ? Medicines to reduce the activity of the immune system. ? A diabetes medicine (metformin). ? Birth control pills, for women. ? Steroids to reduce swelling and  pain.  Working with a mental health care provider, if you experience emotional distress due to this condition. If you have severe symptoms that do not get better with medicine, you may need surgery. Surgery may involve:  Using a laser to clear the skin and remove hair follicles.  Opening and draining deep sores.  Removing the areas of skin that are diseased and scarred. Follow these instructions at home: Medicines  Take over-the-counter and prescription medicines only as told by your health care provider.  If you were prescribed an antibiotic medicine, take it as told by your health care provider. Do not stop taking the antibiotic even if your condition improves.   Skin care  If you have open wounds, cover them with a clean dressing as told by your health care provider. Keep wounds clean by washing them gently with soap and water when you bathe.  Do not shave the areas where you get hidradenitis suppurativa.  Do not wear deodorant.  Wear loose-fitting clothes.  Try to avoid getting overheated or sweaty. If you get sweaty or wet, change into clean, dry clothes as soon as you can.  To help relieve pain and itchiness, cover sore areas with a warm, clean washcloth (warm compress) for 5-10 minutes as often as needed.  If told by your health care provider, take a bleach bath twice a week: ? Fill your bathtub halfway with water. ? Pour in  cup of unscented household bleach. ? Soak in the tub for 5-10 minutes. ? Only soak from the neck down. Avoid water on your face and hair. ? Shower to rinse off the bleach from your skin. General instructions  Learn as much as you can about your disease so that you have an active role in your treatment. Work closely with your health care provider to find treatments that work for you.  If you are overweight, work with your health care provider to lose weight as recommended.  Do not use any products that contain nicotine or tobacco, such as  cigarettes and e-cigarettes. If you need help quitting, ask your health care provider.  If you struggle with living with this condition, talk with your health care provider or work with a mental health care provider as recommended.  Keep all follow-up visits as told by your health care provider. This is important. Where to find more information  Hidradenitis Suppurativa Foundation, Inc.: https://www.hs-foundation.org/  American Academy of Dermatology: Instant[employer] Contact a health care provider if you have:  A flare-up of hidradenitis suppurativa.  A fever or chills.  Trouble controlling your symptoms at home.  Trouble doing your daily activities because of your symptoms.  Trouble dealing with emotional problems related to your condition. Summary  Hidradenitis suppurativa is a long-term (  chronic) skin disease. It is similar to a severe form of acne, but it affects areas of the body where acne would be unusual.  The first symptoms are usually painful bumps in the skin, similar to pimples. The condition may only cause mild symptoms, or it may get worse over time (progress).  If you have open wounds, cover them with a clean dressing as told by your health care provider. Keep wounds clean by washing them gently with soap and water when you bathe.  Besides skin care, treatment may include medicines, laser treatment, and surgery. This information is not intended to replace advice given to you by your health care provider. Make sure you discuss any questions you have with your health care provider. Document Revised: 05/28/2020 Document Reviewed: 05/28/2020 Elsevier Patient Education  2021 [employer].

## 2020-09-19 NOTE — Assessment & Plan Note (Signed)
Recheck Vit D 

## 2020-09-20 ENCOUNTER — Telehealth: Payer: Self-pay

## 2020-09-20 ENCOUNTER — Other Ambulatory Visit: Payer: Self-pay | Admitting: Family Medicine

## 2020-09-20 DIAGNOSIS — E559 Vitamin D deficiency, unspecified: Secondary | ICD-10-CM

## 2020-09-20 LAB — VITAMIN B12: Vitamin B-12: 1107 pg/mL (ref 232–1245)

## 2020-09-20 LAB — COMPREHENSIVE METABOLIC PANEL
ALT: 56 IU/L — ABNORMAL HIGH (ref 0–44)
AST: 32 IU/L (ref 0–40)
Albumin/Globulin Ratio: 2.2 (ref 1.2–2.2)
Albumin: 4.9 g/dL (ref 4.0–5.0)
Alkaline Phosphatase: 111 IU/L (ref 44–121)
BUN/Creatinine Ratio: 12 (ref 9–20)
BUN: 13 mg/dL (ref 6–24)
Bilirubin Total: 0.3 mg/dL (ref 0.0–1.2)
CO2: 17 mmol/L — ABNORMAL LOW (ref 20–29)
Calcium: 9.7 mg/dL (ref 8.7–10.2)
Chloride: 109 mmol/L — ABNORMAL HIGH (ref 96–106)
Creatinine, Ser: 1.08 mg/dL (ref 0.76–1.27)
GFR calc Af Amer: 95 mL/min/{1.73_m2} (ref >59)
GFR calc non Af Amer: 82 mL/min/{1.73_m2} (ref >59)
Globulin, Total: 2.2 g/dL (ref 1.5–4.5)
Glucose: 94 mg/dL (ref 65–99)
Potassium: 4.1 mmol/L (ref 3.5–5.2)
Sodium: 141 mmol/L (ref 134–144)
Total Protein: 7.1 g/dL (ref 6.0–8.5)

## 2020-09-20 LAB — CBC
Hematocrit: 47.8 % (ref 37.5–51.0)
Hemoglobin: 16.2 g/dL (ref 13.0–17.7)
MCH: 31.5 pg (ref 26.6–33.0)
MCHC: 33.9 g/dL (ref 31.5–35.7)
MCV: 93 fL (ref 79–97)
Platelets: 243 10*3/uL (ref 150–450)
RBC: 5.15 x10E6/uL (ref 4.14–5.80)
RDW: 12.1 % (ref 11.6–15.4)
WBC: 7.9 10*3/uL (ref 3.4–10.8)

## 2020-09-20 LAB — TSH: TSH: 2.31 u[IU]/mL (ref 0.450–4.500)

## 2020-09-20 LAB — LIPID PANEL WITH LDL/HDL RATIO
Cholesterol, Total: 208 mg/dL — ABNORMAL HIGH (ref 100–199)
HDL: 39 mg/dL — ABNORMAL LOW (ref >39)
LDL Chol Calc (NIH): 139 mg/dL — ABNORMAL HIGH (ref 0–99)
LDL/HDL Ratio: 3.6 ratio (ref 0.0–3.6)
Triglycerides: 167 mg/dL — ABNORMAL HIGH (ref 0–149)
VLDL Cholesterol Cal: 30 mg/dL (ref 5–40)

## 2020-09-20 LAB — VITAMIN D 25 HYDROXY (VIT D DEFICIENCY, FRACTURES): Vit D, 25-Hydroxy: 20.2 ng/mL — ABNORMAL LOW (ref 30.0–100.0)

## 2020-09-20 LAB — HEPATITIS C ANTIBODY: Hep C Virus Ab: <0.1 s/co ratio (ref 0.0–0.9)

## 2020-09-20 MED ORDER — VITAMIN D (ERGOCALCIFEROL) 1.25 MG (50000 UNIT) PO CAPS: 50000.0000 [IU] | ORAL_CAPSULE | ORAL | 0 refills | Status: DC

## 2020-09-20 NOTE — Progress Notes (Signed)
Send in high dose weekly Vit D (50000 units q7d) x12 weeks and then recheck Vit D

## 2020-09-20 NOTE — Telephone Encounter (Signed)
-----   Message from Erasmo Downer, MD sent at 09/20/2020 12:23 PM EST ----- Send in high dose weekly Vit D (50000 units q7d) x12 weeks and then recheck Vit D

## 2020-11-19 ENCOUNTER — Other Ambulatory Visit: Payer: Self-pay

## 2020-12-23 IMAGING — CT CT RENAL STONE PROTOCOL
2 of 4 series · 16 of 46 positions shown, 18 images · non-contrast
Comparison: None.

CLINICAL DATA: Right flank pain 2 hours radiating to right groin.

EXAM:
CT ABDOMEN AND PELVIS WITHOUT CONTRAST
TECHNIQUE: Multidetector CT imaging of the abdomen and pelvis was performed
following the standard protocol without IV contrast.

[Series 2: stone full standard · axial · 0.85mm/px · z∈[-1093,-568]mm · 13 of 117 slices shown, 15 images]
[im 6/117  soft-tissue]
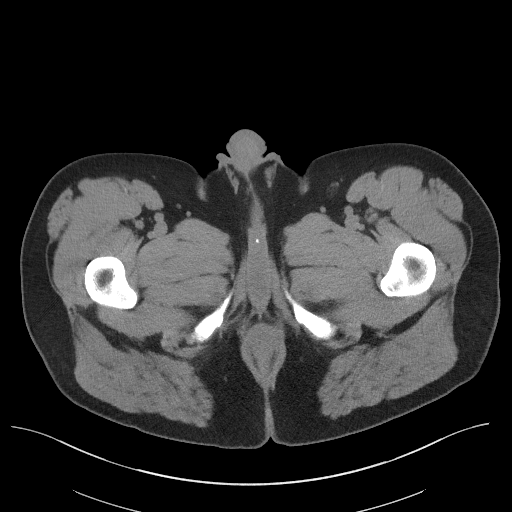
[im 6/117  bone]
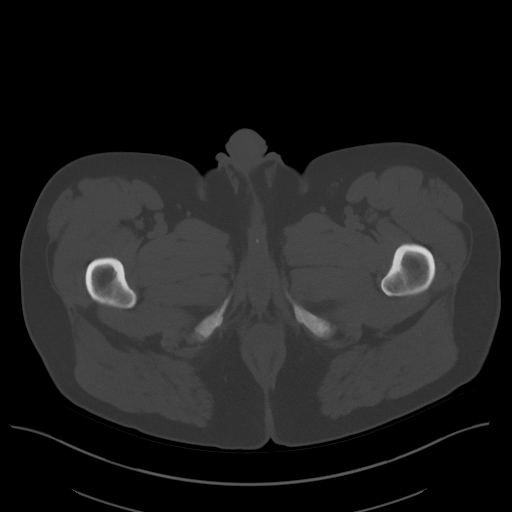
[im 16/117  soft-tissue]
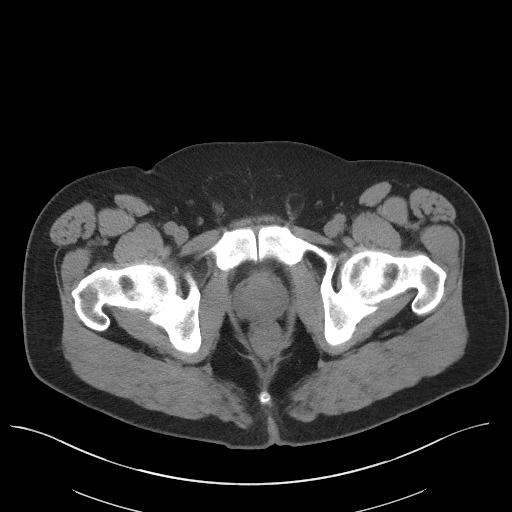
[im 26/117  soft-tissue]
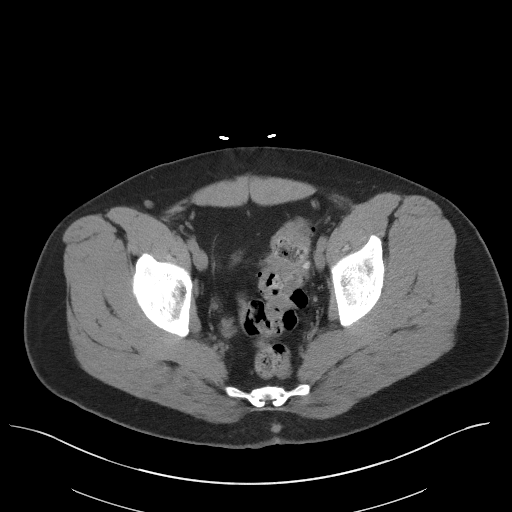
[im 31/117  soft-tissue]
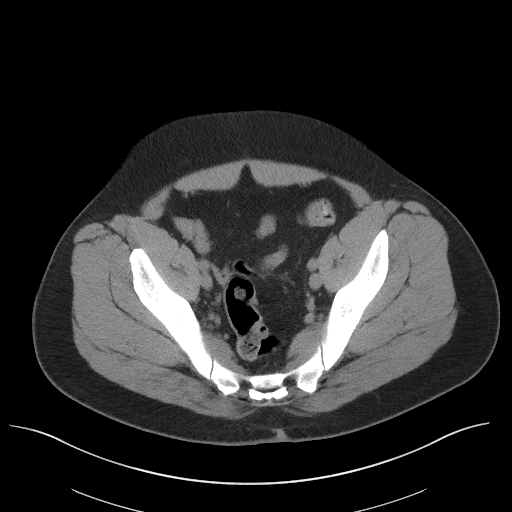
[im 41/117  soft-tissue]
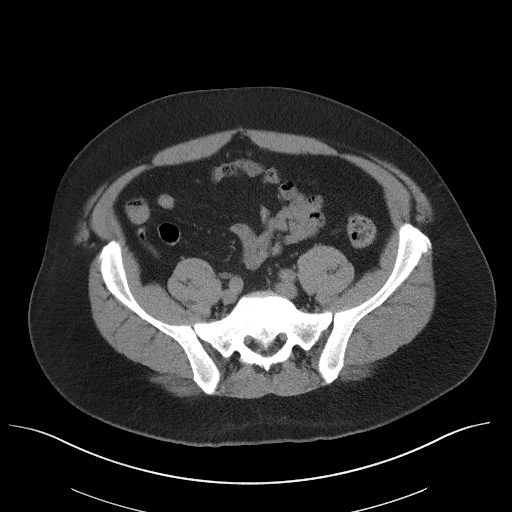
[im 51/117  soft-tissue]
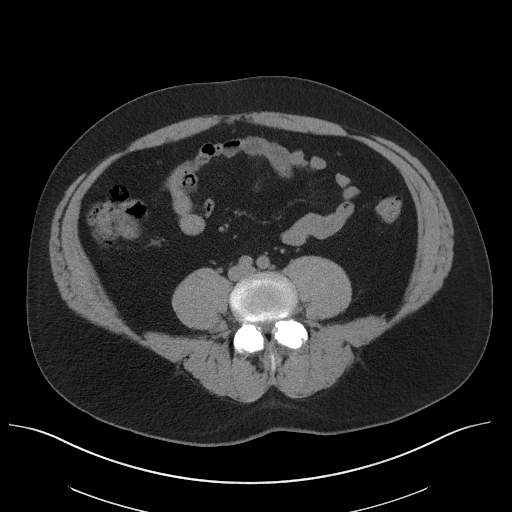
[im 61/117  soft-tissue]
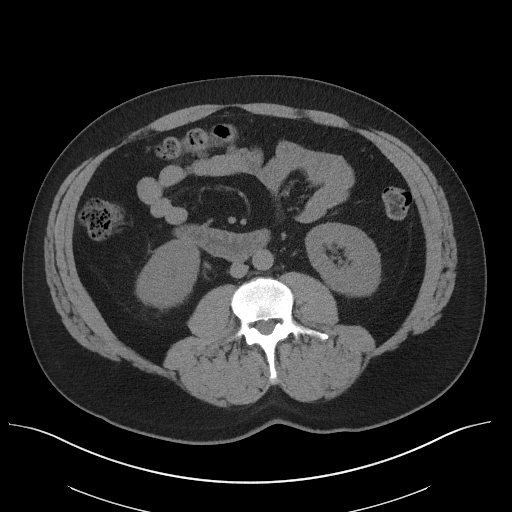
[im 66/117  soft-tissue]
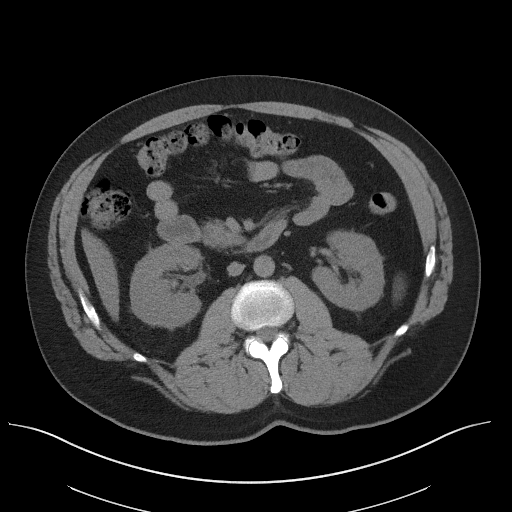
[im 76/117  soft-tissue]
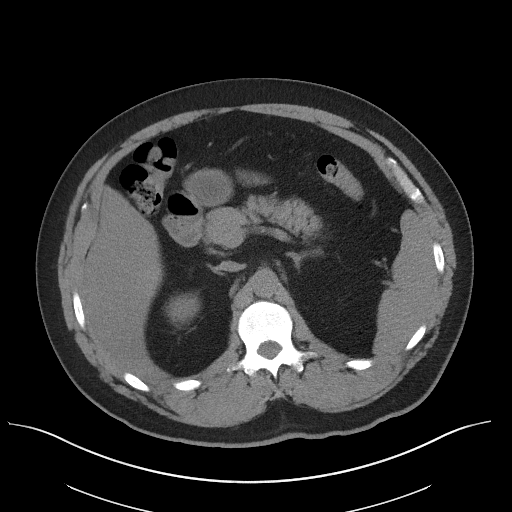
[im 76/117  bone]
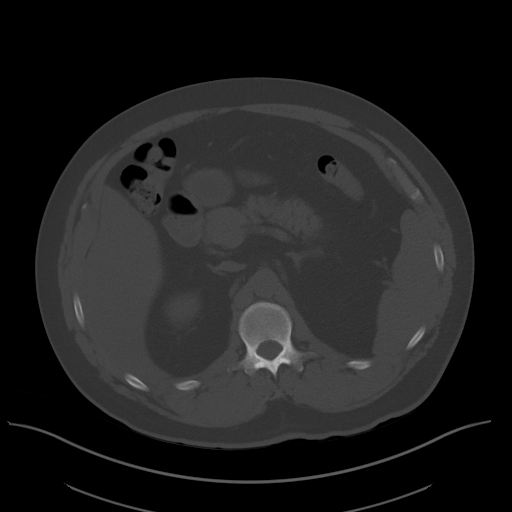
[im 86/117  soft-tissue]
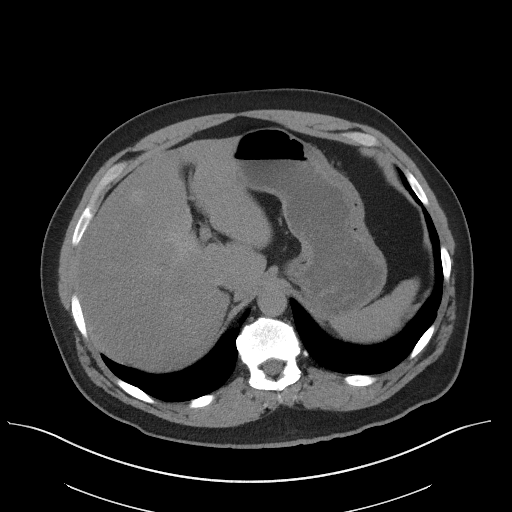
[im 91/117  soft-tissue]
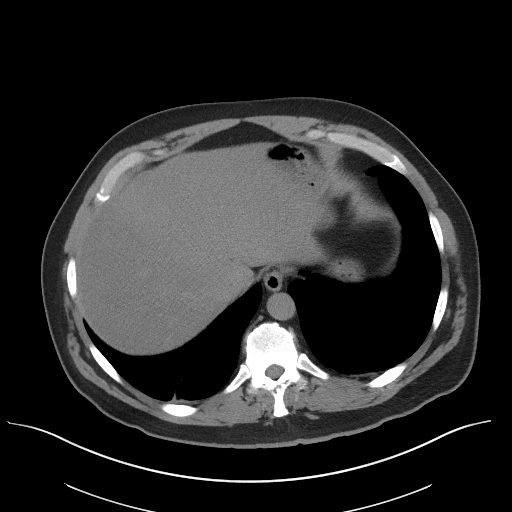
[im 101/117  soft-tissue]
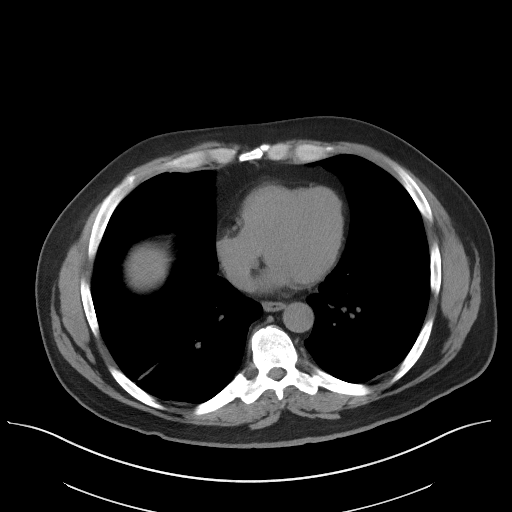
[im 111/117  soft-tissue]
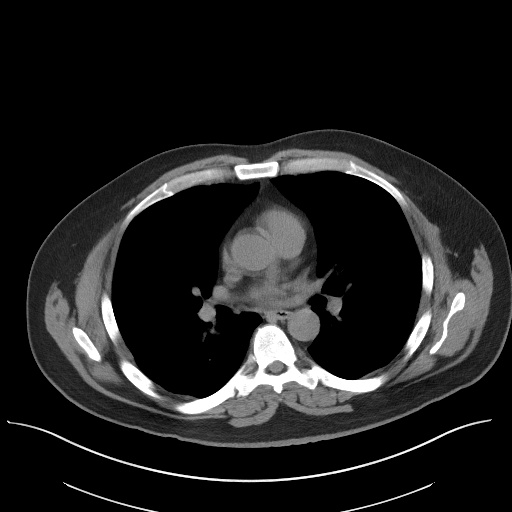

[Series 5: coronal · coronal · 0.85mm/px · 3 of 149 slices shown]
[im 50/149  soft-tissue]
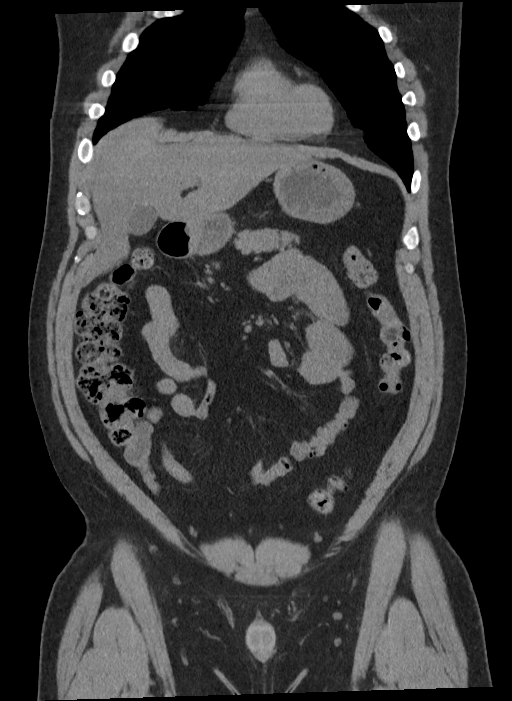
[im 66/149  soft-tissue]
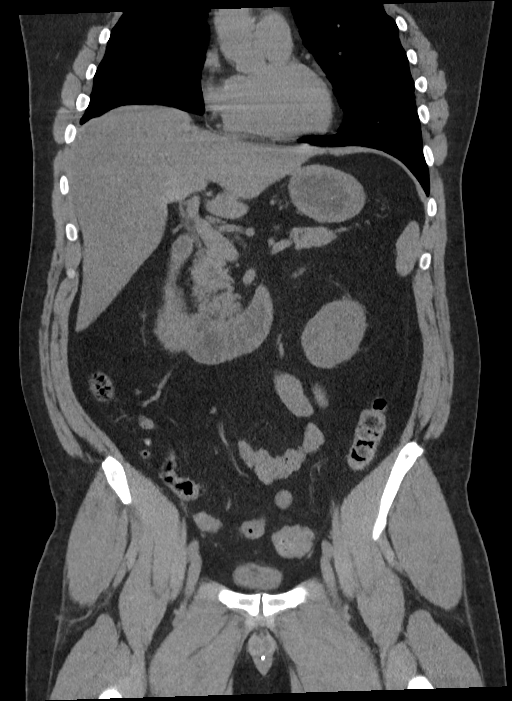
[im 83/149  soft-tissue]
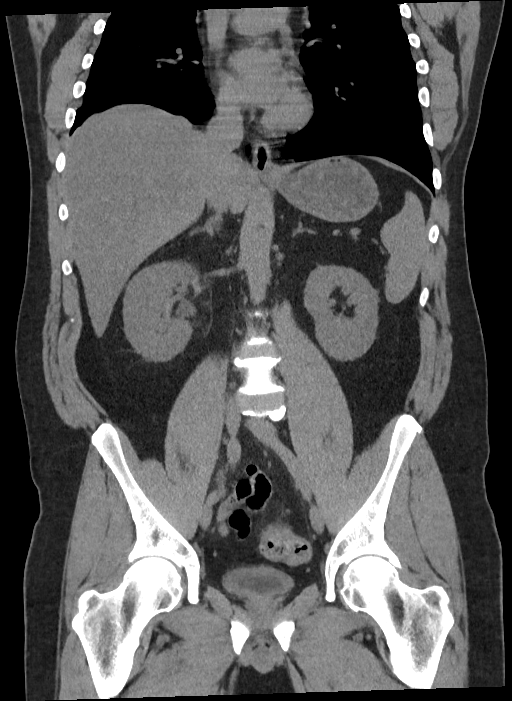

[16 of 46 positions shown; findings below may reference images not displayed]

FINDINGS: Lower chest: Lung bases demonstrate linear atelectasis/scarring over
the right base.

Hepatobiliary: Liver, gallbladder and biliary tree are normal.

Pancreas: Normal.

Spleen: Normal.

Adrenals/Urinary Tract: Adrenal glands are normal. Kidneys normal in
size without hydronephrosis or nephrolithiasis. There is subtle
prominence of the right intrarenal collecting system. There is a
nonobstructing punctate stone over the lower pole left kidney.
Minimal prominence of the right ureter without stones. Left ureter
is normal. Bladder is normal. There is a 3 mm calcification
projecting over the expected region of the mid urethra.

Stomach/Bowel: Stomach and small bowel are normal. Appendix is
normal. Mild diverticulosis of the colon.

Vascular/Lymphatic: Normal.

Reproductive: Normal.

Other: No free fluid or focal inflammatory change.

Musculoskeletal: Unremarkable.
IMPRESSION: Mild prominence of the right intrarenal collecting system and ureter
without evidence of renal or ureteral stones. There is a 3 mm stone
over the mid urethra likely passed from the right collecting system.

Mild colonic diverticulosis.
